# Patient Record
Sex: Male | Born: 1960 | Race: White | Hispanic: No | Marital: Married | Smoking: Never smoker
Health system: Southern US, Community
[De-identification: ages and names within clinical notes are randomized; demographics above are authoritative.]

## PROBLEM LIST (undated history)

## (undated) DIAGNOSIS — R0981 Nasal congestion: Secondary | ICD-10-CM

## (undated) DIAGNOSIS — Z8719 Personal history of other diseases of the digestive system: Secondary | ICD-10-CM

## (undated) DIAGNOSIS — Z800 Family history of malignant neoplasm of digestive organs: Secondary | ICD-10-CM

## (undated) DIAGNOSIS — Z8042 Family history of malignant neoplasm of prostate: Secondary | ICD-10-CM

## (undated) DIAGNOSIS — C61 Malignant neoplasm of prostate: Secondary | ICD-10-CM

## (undated) DIAGNOSIS — Z806 Family history of leukemia: Secondary | ICD-10-CM

## (undated) DIAGNOSIS — Z803 Family history of malignant neoplasm of breast: Secondary | ICD-10-CM

## (undated) DIAGNOSIS — J45909 Unspecified asthma, uncomplicated: Secondary | ICD-10-CM

## (undated) HISTORY — PX: HERNIA REPAIR: SHX51

## (undated) HISTORY — PX: NASAL SEPTUM SURGERY: SHX37

## (undated) HISTORY — DX: Family history of malignant neoplasm of breast: Z80.3

## (undated) HISTORY — DX: Nasal congestion: R09.81

## (undated) HISTORY — DX: Family history of malignant neoplasm of digestive organs: Z80.0

## (undated) HISTORY — DX: Family history of malignant neoplasm of prostate: Z80.42

## (undated) HISTORY — DX: Unspecified asthma, uncomplicated: J45.909

## (undated) HISTORY — DX: Family history of leukemia: Z80.6

---

## 1998-04-10 ENCOUNTER — Ambulatory Visit (HOSPITAL_COMMUNITY): Admission: RE | Admit: 1998-04-10 | Discharge: 1998-04-10 | Payer: Self-pay | Admitting: Otolaryngology

## 2010-05-04 ENCOUNTER — Encounter: Payer: Self-pay | Admitting: Orthopedic Surgery

## 2013-10-05 ENCOUNTER — Ambulatory Visit (HOSPITAL_BASED_OUTPATIENT_CLINIC_OR_DEPARTMENT_OTHER)
Admission: RE | Admit: 2013-10-05 | Discharge: 2013-10-05 | Disposition: A | Discharge status: Home / Self Care | Payer: 59 | Source: Ambulatory Visit | Attending: Pulmonary Disease | Admitting: Pulmonary Disease

## 2013-10-05 ENCOUNTER — Ambulatory Visit (INDEPENDENT_AMBULATORY_CARE_PROVIDER_SITE_OTHER): Payer: PRIVATE HEALTH INSURANCE | Admitting: Pulmonary Disease

## 2013-10-05 ENCOUNTER — Encounter: Payer: Self-pay | Admitting: Pulmonary Disease

## 2013-10-05 VITALS — BP 128/74 | HR 109 | Ht 71.0 in | Wt 224.0 lb

## 2013-10-05 DIAGNOSIS — R05 Cough: Secondary | ICD-10-CM

## 2013-10-05 DIAGNOSIS — R052 Subacute cough: Secondary | ICD-10-CM | POA: Insufficient documentation

## 2013-10-05 DIAGNOSIS — R059 Cough, unspecified: Secondary | ICD-10-CM

## 2013-10-05 MED ORDER — CHLORPHEN-PSEUDOEPH-METHSCOP 8-120-2.5 MG PO TB12: 1.0000 | ORAL_TABLET | Freq: Every day | ORAL | Status: DC

## 2013-10-05 MED ORDER — PSEUDOEPHEDRINE HCL ER 120 MG PO TB12: 120.0000 mg | ORAL_TABLET | Freq: Two times a day (BID) | ORAL | Status: DC

## 2013-10-05 MED ORDER — DEXTROMETHORPHAN POLISTIREX 30 MG/5ML PO LQCR: 5.0000 mL | Freq: Two times a day (BID) | ORAL | Status: DC

## 2013-10-05 NOTE — Assessment & Plan Note (Signed)
Your cough may be related to recent bronchitis or sinus drip Take DELSYM cough syrup 75ml twice daily -daytime Ok to take primatene at night Take chlortrimeton (or equivalent) 8 mg at bedtime x 4 weeks Take sudafed 120 mg XL once daily x 2 weeks Call if no better at 4 weeks CXR today

## 2013-10-05 NOTE — Progress Notes (Signed)
   Subjective:    Patient ID: Paul Potter, male    DOB: May 05, 1960, 53 y.o.   MRN: [id number]  HPI 53 year old never smoker presents for evaluation of subacute cough for 4 weeks. He reports that around [facility], he was in contact with a sick customer and then had URI symptoms requiring an urgent care visit. He was initially treated with Augmentin, prednisone and benzonatate without significant relief. Hydromet cough syrup made him itch .He then saw his PCP and received an albuterol MDI cause tremors and a Z-Pak. He saw Dr. Ernesto Rutherford and was told that this was not allergies.  He reports cough that is worse in the mornings, he reports a tickle in his throat, primatine at night gives him some relief. He denies heartburn, wheezing or sputum production. He is frustrated that her symptoms lasted so long. He had asthma as a child, never bothered him as an adult. He denies seasonal allergies.  Past Medical History  Diagnosis Date  . Asthma   . Sinus congestion     Past Surgical History  Procedure Laterality Date  . Nasal septum surgery      Allergies  Allergen Reactions  . Codeine     itch    History   Social History  . Marital Status: Married    Spouse Name: N/A    Number of Children: N/A  . Years of Education: N/A   Occupational History  . Not on file.   Social History Main Topics  . Smoking status: Never Smoker   . Smokeless tobacco: Never Used  . Alcohol Use: Yes     Comment: occasional glass of wine.  . Drug Use: No  . Sexual Activity: Not on file   Other Topics Concern  . Not on file   Social History Narrative  . No narrative on file    Family History  Problem Relation Age of Onset  . Asthma Son      Review of Systems  Constitutional: Negative for fever and unexpected weight change.  HENT: Negative for congestion, dental problem, ear pain, nosebleeds, postnasal drip, rhinorrhea, sinus pressure, sneezing, sore throat and trouble swallowing.   Eyes:  Negative for redness and itching.  Respiratory: Positive for cough, chest tightness and shortness of breath. Negative for wheezing.   Cardiovascular: Negative for palpitations and leg swelling.  Gastrointestinal: Negative for nausea and vomiting.  Genitourinary: Negative for dysuria.  Musculoskeletal: Negative for joint swelling.  Skin: Negative for rash.  Neurological: Negative for headaches.  Hematological: Does not bruise/bleed easily.  Psychiatric/Behavioral: Negative for dysphoric mood. The patient is not nervous/anxious.        Objective:   Physical Exam  Gen. Pleasant, obese, in no distress, normal affect ENT - no lesions, no post nasal drip, class 2-3 airway Neck: No JVD, no thyromegaly, no carotid bruits Lungs: no use of accessory muscles, no dullness to percussion, decreased without rales or rhonchi  Cardiovascular: Rhythm regular, heart sounds  normal, no murmurs or gallops, no peripheral edema Abdomen: soft and non-tender, no hepatosplenomegaly, BS normal. Musculoskeletal: No deformities, no cyanosis or clubbing Neuro:  alert, non focal, no tremors       Assessment & Plan:

## 2013-10-05 NOTE — Patient Instructions (Addendum)
Your cough may be related to recent bronchitis or sinus drip Take DELSYM cough syrup 17ml twice daily -daytime Ok to take primatene at night Take chlortrimeton (or equivalent) 8 mg at bedtime x 4 weeks Take sudafed 120 mg XL once daily x 2 weeks Call if no better at 4 weeks CXR today

## 2013-11-09 ENCOUNTER — Ambulatory Visit: Payer: PRIVATE HEALTH INSURANCE | Admitting: Pulmonary Disease

## 2013-12-06 ENCOUNTER — Encounter: Payer: PRIVATE HEALTH INSURANCE | Attending: Family Medicine | Admitting: Dietician

## 2013-12-06 ENCOUNTER — Encounter: Payer: Self-pay | Admitting: Dietician

## 2013-12-06 VITALS — Ht 69.0 in | Wt 217.3 lb

## 2013-12-06 DIAGNOSIS — Z713 Dietary counseling and surveillance: Secondary | ICD-10-CM | POA: Insufficient documentation

## 2013-12-06 DIAGNOSIS — E119 Type 2 diabetes mellitus without complications: Secondary | ICD-10-CM | POA: Diagnosis present

## 2013-12-06 NOTE — Patient Instructions (Signed)
Goals:  Follow Diabetes Meal Plan as instructed  Eat 3 meals and 2 snacks, every 3-5 hrs  Limit carbohydrate intake to 45-60 grams carbohydrate/meal  Limit carbohydrate intake to 15 grams carbohydrate/snack  Add lean protein foods to meals/snacks  Aim for 30 mins of physical activity daily  Drink mostly water

## 2013-12-06 NOTE — Progress Notes (Signed)
Appt start time: 0940 end time:  1040.  Assessment:  Patient was seen on  12/06/2013 for individual diabetes education. Paul Potter was just diagnosed with diabetes in July. Not currently testing or taking medication.  Has recently made some changes to his diet after being diagnosed. Drinking 1 diet coke, crystal light, and unsweet tea and has cut out bread, fried potatoes, and has switched whole wheat pasta. Eating mostly grilled meats instead of fried foods. Started exercising in the past few weeks. Was previously drinking regular 3-4 16 oz Cokes, was eating a lot of fast food, and was not exercising outside of yard work. Not skipping meals. Eats out about 14-15 meals per week. Has lost about 4 lbs since July and pants feel looser.   Works in [employer] and travels a lot for work every week. Needs help eating on the road. Lives with his wife and son and both do cooking and food shopping.   Patient Education Plan per assessed needs and concerns is to attend individual session for Diabetes Self Management Education.  Current HbA1c: 6.5% in July 2015  Preferred Learning Style:   No preference indicated   Learning Readiness:   Change in progress  MEDICATIONS: see list  DIETARY INTAKE:  Usual eating pattern includes 3 meals and 0-1 snacks per day.  Avoided foods include: raw tomatoes, asparagus, squash casserole, sushi, English peas  24-hr recall:  B ( 10:30-11:00 M): bacon and egg on the road Snk ( AM): none  L (3:00 PM): salad lunch on low calorie lunch menu at [facility] Tuesday Snk ( PM): low sugar yogurt every once in a while D ( PM):grilled chicken with salad  Snk ( PM): none Beverages: water with crystal light, 1 diet coked, unsweet tea, occasional wine or beer  Usual physical activity: 15-20 minutes of exercise bike 3-4 x week  Estimated energy needs: 2000 calories 225 g carbohydrates 150 g protein 56 g fat  Progress Towards Goal(s):  In progress.   Nutritional Diagnosis:  [place]-2.1  Inpaired nutrition utilization As related to glucose metabolism.  As evidenced by Hgb A1c of 6.5%.    Intervention:  Nutrition counseling provided.  Discussed diabetes disease process and treatment options.  Discussed physiology of diabetes and role of obesity on insulin resistance.  Encouraged moderate weight reduction to improve glucose levels.  Discussed role of medications and diet in glucose control  Provided education on macronutrients on glucose levels.  Provided education on carb counting, importance of regularly scheduled meals/snacks, and meal planning  Discussed effects of physical activity on glucose levels and long-term glucose control.  Recommended 150 minutes of physical activity/week.  Reviewed patient medications.  Discussed role of medication on blood glucose and possible side effects  Discussed blood glucose monitoring and interpretation.  Discussed recommended target ranges and individual ranges.    Described short-term complications: hyper- and hypo-glycemia.  Discussed causes,symptoms, and treatment options.  Discussed prevention, detection, and treatment of long-term complications.  Discussed the role of prolonged elevated glucose levels on body systems.  Discussed role of stress on blood glucose levels and discussed strategies to manage psychosocial issues.  Discussed recommendations for long-term diabetes self-care.  Established checklist for medical, dental, and emotional self-care.  Teaching Method Utilized:  Visual Auditory Hands on  Handouts given during visit include:  Living Well With Diabetes  Blood Glucose Monitoring  Yellow Card  15 g CHO Snacks  Barriers to learning/adherence to lifestyle change: none  Diabetes self-care support plan:   [facility] support group  Wife and son  Demonstrated degree of understanding via:  Teach Back   Monitoring/Evaluation:  Dietary intake, exercise, and body weight prn.

## 2014-04-13 HISTORY — PX: COLONOSCOPY: SHX174

## 2016-03-16 HISTORY — PX: PROSTATE BIOPSY: SHX241

## 2016-03-30 ENCOUNTER — Other Ambulatory Visit: Payer: Self-pay | Admitting: Urology

## 2016-03-30 DIAGNOSIS — C61 Malignant neoplasm of prostate: Secondary | ICD-10-CM

## 2016-04-09 ENCOUNTER — Encounter (HOSPITAL_COMMUNITY)
Admission: RE | Admit: 2016-04-09 | Discharge: 2016-04-09 | Disposition: A | Discharge status: Home / Self Care | Payer: Commercial Managed Care - PPO | Source: Ambulatory Visit | Attending: Urology | Admitting: Urology

## 2016-04-09 DIAGNOSIS — C61 Malignant neoplasm of prostate: Secondary | ICD-10-CM | POA: Diagnosis not present

## 2016-04-09 MED ORDER — TECHNETIUM TC 99M MEDRONATE IV KIT
22.0000 | PACK | Freq: Once | INTRAVENOUS | Status: AC | PRN
  Administered 2016-04-09: 22 via INTRAVENOUS

## 2016-04-21 ENCOUNTER — Encounter: Payer: Self-pay | Admitting: Radiation Oncology

## 2016-05-05 ENCOUNTER — Encounter: Payer: Self-pay | Admitting: Radiation Oncology

## 2016-05-05 NOTE — Progress Notes (Addendum)
GU Location of Tumor / Histology: prostatic adenocarcinoma  If Prostate Cancer, Gleason Score is (4 + 5) and PSA is (4.03) Sept 2017 and while on finasteride  2015 PSA 2.89 while on Finasteride. Reports he had been taking Finasteride for 4-5 years. Reports he stopped finasteride a few weeks ago.  Biopsies of prostate (if applicable) revealed:    Past/Anticipated interventions by urology, if any: prescribed finasteride, prostate biopsy, tentatively scheduled prostatectomy, referral to radiation oncology  Past/Anticipated interventions by medical oncology, if any: no  Weight changes, if any: no  Bowel/Bladder complaints, if any: IPSS:9. Denies dysuria, hematuria, or leakage   Nausea/Vomiting, if any: no  Pain issues, if any:  no  SAFETY ISSUES:  Prior radiation? no  Pacemaker/ICD? no  Possible current pregnancy? no  Is the patient on methotrexate? no  Current Complaints / other details:  56 year old male. Ax. Codeine. Married. Consults with Alinda Money 05/12/16 then, Dr. Tresa Endo (of [facility])

## 2016-05-06 ENCOUNTER — Encounter: Payer: Self-pay | Admitting: Radiation Oncology

## 2016-05-06 ENCOUNTER — Ambulatory Visit
Admission: RE | Admit: 2016-05-06 | Discharge: 2016-05-06 | Disposition: A | Discharge status: Home / Self Care | Payer: Commercial Managed Care - PPO | Source: Ambulatory Visit | Attending: Radiation Oncology | Admitting: Radiation Oncology

## 2016-05-06 VITALS — BP 146/90 | HR 83 | Resp 18 | Ht 67.0 in | Wt 222.8 lb

## 2016-05-06 DIAGNOSIS — C61 Malignant neoplasm of prostate: Secondary | ICD-10-CM | POA: Insufficient documentation

## 2016-05-06 DIAGNOSIS — Z79899 Other long term (current) drug therapy: Secondary | ICD-10-CM | POA: Insufficient documentation

## 2016-05-06 HISTORY — DX: Malignant neoplasm of prostate: C61

## 2016-05-06 NOTE — Progress Notes (Signed)
See progress note under physician encounter. 

## 2016-05-06 NOTE — Progress Notes (Signed)
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    ICD-9-CM ICD-10-CM   1. Malignant neoplasm of prostate ([place]) 185 C61     HISTORY OF PRESENT ILLNESS: Paul Potter is a 56 y.o. male seen at the request of Dr. Pilar Jarvis.  The patient is established with Dr. Pilar Jarvis and undergoing routine PSA screenings. His last PSA was performed 12/13/15 and found to be a 4.03 and while of Finasteride. His previous PSA was 2015 and found to be 2.89 while on Finasteride. His digital rectal examination was abnormal, finding bilateral firmness with nodule at right apex. The patient proceeded to transrectal ultrasound with 12 biopsies of the prostate on 03/16/16 . The prostate volume measured 29.98 cc. Out of 12 core biopsies, 7 were positive. The maximum Gleason score was 4+5, and this was seen in left base lateral and left mid.     The patient reviewed the biopsy results with his urologist and he has kindly been referred today for discussion of potential radiation treatment options.  CT pelvis and bone scan were negative for metastatic disease.  On review of systems, the patient denies any weight changes. He denies pain at this time. He reports nocturia x 3. The patient denies nausea or vomiting. The patient reports he stopped taking Finasteride approximately 2 weeks ago. He reports he has an appointment with Dr. Alinda Money on 05/12/16 for a second opinion.  PREVIOUS RADIATION THERAPY: No  PAST MEDICAL HISTORY:  Past Medical History:  Diagnosis Date  . Asthma   . Prostate cancer ([place])   . Sinus congestion       PAST SURGICAL  HISTORY: Past Surgical History:  Procedure Laterality Date  . HERNIA REPAIR    . NASAL SEPTUM SURGERY    . PROSTATE BIOPSY      FAMILY HISTORY:  Family History  Problem Relation Age of Onset  . Asthma Son   . Cancer Father     hairy cell leukemia  . Cancer Sister     breast    SOCIAL HISTORY:  Social History   Social History  . Marital status: Married    Spouse name: N/A  . Number of children: N/A  . Years of education: N/A   Occupational History  . Not on file.   Social History Main Topics  . Smoking status: Never Smoker  . Smokeless tobacco: Never Used  . Alcohol use Yes     Comment: occasional glass of wine.  . Drug use: No  . Sexual activity: Yes   Other Topics Concern  . Not on file   Social History Narrative  . No narrative on file    ALLERGIES: Hydrocodone and Codeine  MEDICATIONS:  Current Outpatient Prescriptions  Medication Sig Dispense Refill  . Multiple Vitamins-Minerals (MULTIVITAMIN & MINERAL PO) Take by mouth.    Marland Kitchen OVER THE COUNTER MEDICATION Primitine (OTC) 2 tablets qhs.    . chlorpheniramine-pseudoephedrine-methscopolamine (MESCOLOR) 8-120-2.5 MG per 12 hr tablet Take 1 tablet by mouth at bedtime. (Patient not taking: Reported on 05/06/2016) 28 tablet 0  . dextromethorphan (DELSYM) 30 MG/5ML liquid Take 5 mLs (30 mg total) by mouth  2 (two) times daily. (Patient not taking: Reported on 05/06/2016) 148 mL 0  . finasteride (PROSCAR) 5 MG tablet Take 5 mg by mouth daily.    . pseudoephedrine (SUDAFED 12 HOUR) 120 MG 12 hr tablet Take 1 tablet (120 mg total) by mouth 2 (two) times daily. (Patient not taking: Reported on 05/06/2016) 14 tablet 0   No current facility-administered medications for this encounter.     REVIEW OF SYSTEMS:  On review of systems, the patient reports that he is doing well overall. He denies any chest pain, shortness of breath, cough, fevers, chills, night sweats, unintended weight changes. He denies any bowel disturbances,  and denies abdominal pain, nausea or vomiting. He denies any new musculoskeletal or joint aches or pains. A complete review of systems is obtained and is otherwise negative. The patient completed an IPSS and IIEF questionnaire.  His IPSS score was 9 indicating moderate urinary outflow obstructive symptoms.  He indicated that his erectile function is sometimes able to complete sexual activity.    PHYSICAL EXAM:  Wt Readings from Last 3 Encounters:  05/06/16 222 lb 12.8 oz (101.1 kg)  12/06/13 217 lb 4.8 oz (98.6 kg)  10/05/13 224 lb (101.6 kg)   Temp Readings from Last 3 Encounters:  No data found for Temp   BP Readings from Last 3 Encounters:  05/06/16 (!) 146/90  10/05/13 128/74   Pulse Readings from Last 3 Encounters:  05/06/16 83  10/05/13 (!) 109   Pain Scale 0/10 In general this is a well appearing Caucasian gnetleman in no acute distress. He is alert and oriented x4 and appropriate throughout the examination.   KPS = 100  100 - Normal; no complaints; no evidence of disease. 90   - Able to carry on normal activity; minor signs or symptoms of disease. 80   - Normal activity with effort; some signs or symptoms of disease. 20   - Cares for self; unable to carry on normal activity or to do active work. 60   - Requires occasional assistance, but is able to care for most of his personal needs. 50   - Requires considerable assistance and frequent medical care. 70   - Disabled; requires special care and assistance. 24   - Severely disabled; hospital admission is indicated although death not imminent. 33   - Very sick; hospital admission necessary; active supportive treatment necessary. 10   - Moribund; fatal processes progressing rapidly. 0     - Dead  Karnofsky DA, Abelmann Hobson, Craver LS and Burchenal JH [phone]) The use of the nitrogen mustards in the palliative treatment of carcinoma: with particular reference to bronchogenic carcinoma Cancer 1 634-56  LABORATORY DATA:  No  results found for: WBC, HGB, HCT, MCV, PLT No results found for: NA, K, CL, CO2 No results found for: ALT, AST, GGT, ALKPHOS, BILITOT   RADIOGRAPHY: Nm Bone Scan Whole Body  Result Date: 04/09/2016 CLINICAL DATA:  Prostate cancer. EXAM: NUCLEAR MEDICINE WHOLE BODY BONE SCAN TECHNIQUE: Whole body anterior and posterior images were obtained approximately 3 hours after intravenous injection of radiopharmaceutical. RADIOPHARMACEUTICALS:  22 mCi Technetium-76m MDP IV COMPARISON:  None. FINDINGS: Slight increased activity at the left sternoclavicular joint at the left sternomanubrial joint. These are probably arthritic in origin. Focal increased activity at the right first metatarsal phalangeal joint. No other areas of abnormally increased or decreased activity IMPRESSION: No evidence suggestive of metastatic disease. 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  Electronically Signed   By: Lorriane Shire M.D.   On: 04/09/2016 14:41      IMPRESSION/PLAN: 1. 56 y.o. 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   We talked about surgical outcomes.  We reviewed the the implications of positive margins, extracapsular extension, and seminal vesicle involvement on the risk of prostate cancer recurrence. We reviewed some of the evidence suggesting an advantage for patients who undergo adjuvant radiotherapy in the setting in terms of disease control and overall survival. We also discussed some of the dilemmas related to the available evidence.  We discussed the SWOG trial which did show an improvement in disease-free survival as well as overall survival using adjuvant radiotherapy. However, we discussed the fact that the study did not carefully control the usage of adjuvant radiotherapy in the observation arm. There is increasing evidence that careful surveillance with ultrasensitive PSA may  provide an opportunity for early salvage in patients who undergo observation, which can lead to excellent results in terms of disease control and survival. We discussed radiation treatment directed to the prostatic fossa with regard to the logistics and delivery of external beam radiation treatment.  I'll look forward to following up with the patient post-op if clinicaly indicated.   Freeman Caldron PAC  And    Tyler Pita, MD [place] Director and Director of Stereotactic Radiosurgery Direct Dial: [phone]  Fax: [phone] [email or web address]  Skype  [email or web address]  This document serves as a record of services personally performed by Tyler Pita, MD and Freeman Caldron, PA-C. It was created on their behalf by Maryla Morrow, a trained medical scribe. The creation of this record is based on the scribe's personal observations and the provider's statements to them. This document has been checked and approved by the attending provider.

## 2016-05-14 ENCOUNTER — Other Ambulatory Visit: Payer: Self-pay | Admitting: Urology

## 2016-05-26 ENCOUNTER — Telehealth: Payer: Self-pay | Admitting: Medical Oncology

## 2016-05-26 NOTE — Progress Notes (Signed)
I called Paul Potter to introduce myself as the prostate navigator and my role. I was not able to meet him the day he consulted with Dr. Tammi Klippel. He is scheduled for robotic prostatectomy 3/12 with Dr. Alinda Money.I He attended his first pelvic PT classes yesterday. We discussed how important the exercises will be to regain his continence. He states that there is a 40% chance he may need radiation post-op. I asked him to call me with any questions or concerns. He voiced understanding.

## 2016-06-10 ENCOUNTER — Encounter (HOSPITAL_COMMUNITY): Admission: RE | Payer: Self-pay | Source: Ambulatory Visit

## 2016-06-10 ENCOUNTER — Inpatient Hospital Stay (HOSPITAL_COMMUNITY): Admission: RE | Admit: 2016-06-10 | Payer: PRIVATE HEALTH INSURANCE | Source: Ambulatory Visit | Admitting: Urology

## 2016-06-10 SURGERY — PROSTATECTOMY, RADICAL, ROBOT-ASSISTED, LAPAROSCOPIC
Anesthesia: General

## 2016-06-12 ENCOUNTER — Encounter (HOSPITAL_COMMUNITY): Payer: Self-pay

## 2016-06-12 ENCOUNTER — Encounter (HOSPITAL_COMMUNITY)
Admission: RE | Admit: 2016-06-12 | Discharge: 2016-06-12 | Disposition: A | Discharge status: Home / Self Care | Payer: Commercial Managed Care - PPO | Source: Ambulatory Visit | Attending: Urology | Admitting: Urology

## 2016-06-12 DIAGNOSIS — C61 Malignant neoplasm of prostate: Secondary | ICD-10-CM | POA: Insufficient documentation

## 2016-06-12 DIAGNOSIS — Z01818 Encounter for other preprocedural examination: Secondary | ICD-10-CM | POA: Diagnosis not present

## 2016-06-12 HISTORY — DX: Personal history of other diseases of the digestive system: Z87.19

## 2016-06-12 LAB — BASIC METABOLIC PANEL
Anion gap: 9 (ref 5–15)
BUN: 16 mg/dL (ref 6–20)
CHLORIDE: 103 mmol/L (ref 101–111)
CO2: 26 mmol/L (ref 22–32)
CREATININE: 0.96 mg/dL (ref 0.61–1.24)
Calcium: 9.2 mg/dL (ref 8.9–10.3)
GFR calc Af Amer: >60 mL/min (ref >60)
GFR calc non Af Amer: >60 mL/min (ref >60)
GLUCOSE: 117 mg/dL — AB (ref 65–99)
POTASSIUM: 3.8 mmol/L (ref 3.5–5.1)
Sodium: 138 mmol/L (ref 135–145)

## 2016-06-12 LAB — CBC
HCT: 43.8 % (ref 39.0–52.0)
Hemoglobin: 15.2 g/dL (ref 13.0–17.0)
MCH: 30.2 pg (ref 26.0–34.0)
MCHC: 34.7 g/dL (ref 30.0–36.0)
MCV: 86.9 fL (ref 78.0–100.0)
Platelets: 248 10*3/uL (ref 150–400)
RBC: 5.04 MIL/uL (ref 4.22–5.81)
RDW: 12.6 % (ref 11.5–15.5)
WBC: 9.5 10*3/uL (ref 4.0–10.5)

## 2016-06-12 LAB — ABO/RH: ABO/RH(D): O NEG

## 2016-06-12 NOTE — Patient Instructions (Addendum)
Paul Potter  06/12/2016   Your procedure is scheduled on: 06-22-16  Report to [facility] Main  Entrance take [facility]  elevators to 3rd floor to  Paul Potter at [phone].  Call this number if you have problems the morning of surgery [phone]   Remember: ONLY 1 PERSON MAY GO WITH YOU TO SHORT STAY TO GET  READY MORNING OF [place].  Do not eat food :After Midnight Saturday 06-20-16. Clear liquids all day Sunday 06-21-16 as per Dr Lynne Logan orders for bowel prep. Nothing by mouth after midnight Sunday night.      Take these medicines the morning of surgery with A SIP OF WATER: none                                You may not have any metal on your body including hair pins and              piercings  Do not wear jewelry, make-up, lotions, powders or perfumes, deodorant             Do not wear nail polish.  Do not shave  48 hours prior to surgery.              Men may shave face and neck.   Do not bring valuables to the hospital. [place].  Contacts, dentures or bridgework may not be worn into surgery.  Leave suitcase in the car. After surgery it may be brought to your room.                Please read over the following fact sheets you were given: _____________________________________________________________________     CLEAR LIQUID DIET   Foods Allowed                                                                     Foods Excluded  Coffee and tea, regular and decaf                             liquids that you cannot  Plain Jell-O in any flavor                                             see through such as: Fruit ices (not with fruit pulp)                                     milk, soups, orange juice  Iced Popsicles                                    All solid food Carbonated beverages, regular and diet  Cranberry, grape and apple juices Sports drinks like  Gatorade Lightly seasoned clear broth or consume(fat free) Sugar, honey syrup  Sample Menu Breakfast                                Lunch                                     Supper Cranberry juice                    Beef broth                            Chicken broth Jell-O                                     Grape juice                           Apple juice Coffee or tea                        Jell-O                                      Popsicle                                                Coffee or tea                        Coffee or tea  _____________________________________________________________________             [facility] - Preparing for Surgery Before surgery, you can play an important role.  Because skin is not sterile, your skin needs to be as free of germs as possible.  You can reduce the number of germs on your skin by washing with CHG (chlorahexidine gluconate) soap before surgery.  CHG is an antiseptic cleaner which kills germs and bonds with the skin to continue killing germs even after washing. Please DO NOT use if you have an allergy to CHG or antibacterial soaps.  If your skin becomes reddened/irritated stop using the CHG and inform your nurse when you arrive at Short Stay. Do not shave (including legs and underarms) for at least 48 hours prior to the first CHG shower.  You may shave your face/neck. Please follow these instructions carefully:  1.  Shower with CHG Soap the night before surgery and the  morning of Surgery.  2.  If you choose to wash your hair, wash your hair first as usual with your  normal  shampoo.  3.  After you shampoo, rinse your hair and body thoroughly to remove the  shampoo.                           4.  Use CHG as you would any other liquid soap.  You can apply chg  directly  to the skin and wash                       Gently with a scrungie or clean washcloth.  5.  Apply the CHG Soap to your body ONLY FROM THE NECK DOWN.   Do not use on face/  open                           Wound or open sores. Avoid contact with eyes, ears mouth and genitals (private parts).                       Wash face,  Genitals (private parts) with your normal soap.             6.  Wash thoroughly, paying special attention to the area where your surgery  will be performed.  7.  Thoroughly rinse your body with warm water from the neck down.  8.  DO NOT shower/wash with your normal soap after using and rinsing off  the CHG Soap.                9.  Pat yourself dry with a clean towel.            10.  Wear clean pajamas.            11.  Place clean sheets on your bed the night of your first shower and do not  sleep with pets. Day of Surgery : Do not apply any lotions/deodorants the morning of surgery.  Please wear clean clothes to the hospital/surgery center.  FAILURE TO FOLLOW THESE INSTRUCTIONS MAY RESULT IN THE CANCELLATION OF YOUR SURGERY PATIENT SIGNATURE_________________________________  NURSE SIGNATURE__________________________________  ________________________________________________________________________   Paul Potter  An incentive spirometer is a tool that can help keep your lungs clear and active. This tool measures how well you are filling your lungs with each breath. Taking long deep breaths may help reverse or decrease the chance of developing breathing (pulmonary) problems (especially infection) following:  A long period of time when you are unable to move or be active. BEFORE THE PROCEDURE   If the spirometer includes an indicator to show your best effort, your nurse or respiratory therapist will set it to a desired goal.  If possible, sit up straight or lean slightly forward. Try not to slouch.  Hold the incentive spirometer in an upright position. INSTRUCTIONS FOR USE  1. Sit on the edge of your bed if possible, or sit up as far as you can in bed or on a chair. 2. Hold the incentive spirometer in an upright  position. 3. Breathe out normally. 4. Place the mouthpiece in your mouth and seal your lips tightly around it. 5. Breathe in slowly and as deeply as possible, raising the piston or the ball toward the top of the column. 6. Hold your breath for 3-5 seconds or for as long as possible. Allow the piston or ball to fall to the bottom of the column. 7. Remove the mouthpiece from your mouth and breathe out normally. 8. Rest for a few seconds and repeat Steps 1 through 7 at least 10 times every 1-2 hours when you are awake. Take your time and take a few normal breaths between deep breaths. 9. The spirometer may include an indicator to show your best effort. Use the indicator as a goal to work toward during each repetition.  10. After each set of 10 deep breaths, practice coughing to be sure your lungs are clear. If you have an incision (the cut made at the time of surgery), support your incision when coughing by placing a pillow or rolled up towels firmly against it. Once you are able to get out of bed, walk around indoors and cough well. You may stop using the incentive spirometer when instructed by your caregiver.  RISKS AND COMPLICATIONS  Take your time so you do not get dizzy or light-headed.  If you are in pain, you may need to take or ask for pain medication before doing incentive spirometry. It is harder to take a deep breath if you are having pain. AFTER USE  Rest and breathe slowly and easily.  It can be helpful to keep track of a log of your progress. Your caregiver can provide you with a simple table to help with this. If you are using the spirometer at home, follow these instructions: [place] IF:   You are having difficultly using the spirometer.  You have trouble using the spirometer as often as instructed.  Your pain medication is not giving enough relief while using the spirometer.  You develop fever of 100.5 F (38.1 C) or higher. SEEK IMMEDIATE MEDICAL CARE IF:    You cough up bloody sputum that had not been present before.  You develop fever of 102 F (38.9 C) or greater.  You develop worsening pain at or near the incision site. MAKE SURE YOU:   Understand these instructions.  Will watch your condition.  Will get help right away if you are not doing well or get worse. Document Released: 08/10/2006 Document Revised: 06/22/2011 Document Reviewed: 10/11/2006 ExitCare Patient Information 2014 ExitCare, [place].   ________________________________________________________________________  WHAT IS A BLOOD TRANSFUSION? Blood Transfusion Information  A transfusion is the replacement of blood or some of its parts. Blood is made up of multiple cells which provide different functions.  Red blood cells carry oxygen and are used for blood loss replacement.  White blood cells fight against infection.  Platelets control bleeding.  Plasma helps clot blood.  Other blood products are available for specialized needs, such as hemophilia or other clotting disorders. BEFORE THE TRANSFUSION  Who gives blood for transfusions?   Healthy volunteers who are fully evaluated to make sure their blood is safe. This is blood bank blood. Transfusion therapy is the safest it has ever been in the practice of medicine. Before blood is taken from a donor, a complete history is taken to make sure that person has no history of diseases nor engages in risky social behavior (examples are intravenous drug use or sexual activity with multiple partners). The donor's travel history is screened to minimize risk of transmitting infections, such as malaria. The donated blood is tested for signs of infectious diseases, such as HIV and hepatitis. The blood is then tested to be sure it is compatible with you in order to minimize the chance of a transfusion reaction. If you or a relative donates blood, this is often done in anticipation of surgery and is not appropriate for emergency  situations. It takes many days to process the donated blood. RISKS AND COMPLICATIONS Although transfusion therapy is very safe and saves many lives, the main dangers of transfusion include:   Getting an infectious disease.  Developing a transfusion reaction. This is an allergic reaction to something in the blood you were given. Every precaution is taken to prevent this. The  decision to have a blood transfusion has been considered carefully by your caregiver before blood is given. Blood is not given unless the benefits outweigh the risks. AFTER THE TRANSFUSION  Right after receiving a blood transfusion, you will usually feel much better and more energetic. This is especially true if your red blood cells have gotten low (anemic). The transfusion raises the level of the red blood cells which carry oxygen, and this usually causes an energy increase.  The nurse administering the transfusion will monitor you carefully for complications. HOME CARE INSTRUCTIONS  No special instructions are needed after a transfusion. You may find your energy is better. Speak with your caregiver about any limitations on activity for underlying diseases you may have. SEEK MEDICAL CARE IF:   Your condition is not improving after your transfusion.  You develop redness or irritation at the intravenous (IV) site. SEEK IMMEDIATE MEDICAL CARE IF:  Any of the following symptoms occur over the next 12 hours:  Shaking chills.  You have a temperature by mouth above 102 F (38.9 C), not controlled by medicine.  Chest, back, or muscle pain.  People around you feel you are not acting correctly or are confused.  Shortness of breath or difficulty breathing.  Dizziness and fainting.  You get a rash or develop hives.  You have a decrease in urine output.  Your urine turns a dark color or changes to pink, red, or brown. Any of the following symptoms occur over the next 10 days:  You have a temperature by mouth above  102 F (38.9 C), not controlled by medicine.  Shortness of breath.  Weakness after normal activity.  The white part of the eye turns yellow (jaundice).  You have a decrease in the amount of urine or are urinating less often.  Your urine turns a dark color or changes to pink, red, or brown. Document Released: 03/27/2000 Document Revised: 06/22/2011 Document Reviewed: 11/14/2007 [facility] Patient Information 2014 [place], [place].  _______________________________________________________________________

## 2016-06-19 NOTE — H&P (Signed)
CC/HPI: CC: Prostate Cancer    Paul Potter is a 56 year old gentleman who was noted to have an elevated PSA of 4.03 while taking 5 ARI medication for BPH. He underwent a TRUS biopsy of the prostate by Dr. Pilar Jarvis on 03/16/16 confirming Gleason 4+5=9 adenocarcinoma of the prostate with 7 out of 12 biopsy cores positive for malignancy.   Family history: None.   Imaging studies:  CT (04/09/16) - negative for metastatic disease  Bone scan (04/09/16) - negative for metastatic disease   PMH: He has a history of asthma and gout.  PSH: Right inguinal hernia repair.   TNM stage: cT2c (bilateral firmness)  PSA: 4.03 (on 5 ARI)  Gleason score: 4+5=9  Biopsy (03/16/16): 7/12 cores positive  Left: L lateral apex (60%, 4+4=8), L apex (70%, 4+4=8), L lateral mid (90%, 4+4=8, PNI), L mid (60%, 4+5=9), L lateral base (90%, 4+5=9), L base (60%, 4+4=8, PNI)  Right: R apex (5%, 3+3=6)  Prostate volume: 30.0 cc   Nomogram  OC disease: 16%  EPE: 80%  SVI: 35%  LNI: 34%  PFS (5 year, 10 year): 42%,28%   Urinary function: IPSS is 10.  Erectile function: SHIM score is 21.     ALLERGIES: Codeine No Allergies    MEDICATIONS: Finasteride 5 MG Oral Tablet Oral  Multi-Day TABS Oral  Vitamin D     GU PSH: Locm 300-399Mg /Ml Iodine,1Ml - 04/09/2016 Prostate Needle Biopsy - 03/16/2016    NON-GU PSH: Inguinal Hernia Repair > 5 yrs, Right, inguinal Surgical Pathology, Gross And Microscopic Examination For Prostate Needle - 03/16/2016    GU PMH: Prostate Cancer - 03/30/2016 BPH w/o LUTS - 02/11/2016 Nodular prostate w/o LUTS, Nodular prostate without lower urinary tract symptoms - 2014 Urinary Frequency, Increased urinary frequency - 2014      PMH Notes:    NON-GU PMH: Asthma, Asthma - 2014 Glycosuria, Glycosuria - 2014 Gout    FAMILY HISTORY: 1 son - Son Family Health Status - Mother's Age - Runs In Family Father Deceased At [employer] ___ - Runs In Family father still living 22 -  Father mother deceased at age 72 - Mother   SOCIAL HISTORY: Marital Status: Married Current Smoking Status: Patient has never smoked.  Drinks 1 drink per week.  Drinks 2 caffeinated drinks per day. Patient's occupation Brewing technologist.     Notes: Marital History - Currently Married, Occupation:, Caffeine Use, Never A Smoker, Alcohol Use   REVIEW OF SYSTEMS:    GU Review Male:   Patient denies frequent urination, hard to postpone urination, burning/ pain with urination, get up at night to urinate, leakage of urine, stream starts and stops, trouble starting your streams, and have to strain to urinate .  Gastrointestinal (Upper):   Patient denies nausea and vomiting.  Gastrointestinal (Lower):   Patient denies diarrhea and constipation.  Constitutional:   Patient denies fever, night sweats, weight loss, and fatigue.  Skin:   Patient denies skin rash/ lesion and itching.  Eyes:   Patient denies blurred vision and double vision.  Ears/ Nose/ Throat:   Patient denies sore throat and sinus problems.  Hematologic/Lymphatic:   Patient denies swollen glands and easy bruising.  Cardiovascular:   Patient denies leg swelling and chest pains.  Respiratory:   Patient denies cough and shortness of breath.  Endocrine:   Patient denies excessive thirst.  Musculoskeletal:   Patient denies joint pain and back pain.  Neurological:   Patient denies headaches and dizziness.  Psychologic:  Patient denies depression and anxiety.   VITAL SIGNS:   Weight 221 lb / 100.24 kg  Height 67 in / 170.18 cm  BMI 34.6 kg/m     MULTI-SYSTEM PHYSICAL EXAMINATION:    Constitutional: Well-nourished. No physical deformities. Normally developed. Good grooming.  Neck: Neck symmetrical, not swollen. Normal tracheal position.  Respiratory: No labored breathing, no use of accessory muscles. Clear bilaterally.  Cardiovascular: Normal temperature, normal extremity pulses, no swelling, no varicosities. RRR.  Lymphatic: No  enlargement of neck, axillae, groin.  Skin: No paleness, no jaundice, no cyanosis. No lesion, no ulcer, no rash.  Neurologic / Psychiatric: Oriented to time, oriented to place, oriented to person. No depression, no anxiety, no agitation.  Gastrointestinal: Mild obesity. Right inguinal scar.  Eyes: Normal conjunctivae. Normal eyelids.  Ears, Nose, Mouth, and Throat: Left ear no scars, no lesions, no masses. Right ear no scars, no lesions, no masses. Nose no scars, no lesions, no masses. Normal hearing. Normal lips.  Musculoskeletal: Normal gait and station of head and neck.       ASSESSMENT:      ICD-10 Details  1 GU:   Prostate Cancer - C61    PLAN:      1. Prostate cancer: He has elected surgical therapy and will undergo a right nerve sparing robot-assisted laparoscopic radical prostatectomy and bilateral pelvic lymphadenectomy. I discussed the potential benefits and risks of the procedure, side effects of the proposed treatment, the likelihood of the patient achieving the goals of the procedure, and any potential problems that might occur during the procedure or recuperation.

## 2016-06-22 ENCOUNTER — Ambulatory Visit (HOSPITAL_COMMUNITY): Payer: Commercial Managed Care - PPO | Admitting: Anesthesiology

## 2016-06-22 ENCOUNTER — Encounter (HOSPITAL_COMMUNITY): Payer: Self-pay

## 2016-06-22 ENCOUNTER — Encounter (HOSPITAL_COMMUNITY)
Admission: RE | Disposition: A | Discharge status: Home / Self Care | Payer: Self-pay | Source: Ambulatory Visit | Attending: Urology

## 2016-06-22 ENCOUNTER — Ambulatory Visit (HOSPITAL_COMMUNITY)
Admission: RE | Admit: 2016-06-22 | Discharge: 2016-06-23 | Disposition: A | Discharge status: Home / Self Care | Payer: Commercial Managed Care - PPO | Source: Ambulatory Visit | Attending: Urology | Admitting: Urology

## 2016-06-22 DIAGNOSIS — J45909 Unspecified asthma, uncomplicated: Secondary | ICD-10-CM | POA: Diagnosis not present

## 2016-06-22 DIAGNOSIS — C61 Malignant neoplasm of prostate: Secondary | ICD-10-CM | POA: Diagnosis not present

## 2016-06-22 DIAGNOSIS — M109 Gout, unspecified: Secondary | ICD-10-CM | POA: Insufficient documentation

## 2016-06-22 DIAGNOSIS — Z6834 Body mass index (BMI) 34.0-34.9, adult: Secondary | ICD-10-CM | POA: Insufficient documentation

## 2016-06-22 HISTORY — PX: LAPAROSCOPY W/ LYMPHADENECTOMY / SAMPLING / BIOPSY LYMPH NODE: SUR806

## 2016-06-22 HISTORY — PX: ROBOT ASSISTED LAPAROSCOPIC RADICAL PROSTATECTOMY: SHX5141

## 2016-06-22 LAB — HEMOGLOBIN AND HEMATOCRIT, BLOOD
HCT: 44.2 % (ref 39.0–52.0)
HEMOGLOBIN: 14.8 g/dL (ref 13.0–17.0)

## 2016-06-22 LAB — TYPE AND SCREEN
ABO/RH(D): O NEG
Antibody Screen: NEGATIVE

## 2016-06-22 SURGERY — ROBOTIC ASSISTED LAPAROSCOPIC RADICAL PROSTATECTOMY LEVEL 1
Anesthesia: General

## 2016-06-22 MED ORDER — ROCURONIUM BROMIDE 50 MG/5ML IV SOSY
PREFILLED_SYRINGE | INTRAVENOUS | Status: AC
  Filled 2016-06-22: qty 5

## 2016-06-22 MED ORDER — BUPIVACAINE-EPINEPHRINE 0.25% -1:200000 IJ SOLN
INTRAMUSCULAR | Status: AC
Start: 2016-06-22 — End: 2016-06-22
  Filled 2016-06-22: qty 1

## 2016-06-22 MED ORDER — PROMETHAZINE HCL 25 MG/ML IJ SOLN: 6.2500 mg | INTRAMUSCULAR | Status: DC | PRN

## 2016-06-22 MED ORDER — ROCURONIUM BROMIDE 100 MG/10ML IV SOLN
INTRAVENOUS | Status: DC | PRN
  Administered 2016-06-22 (×2): 10 mg via INTRAVENOUS
  Administered 2016-06-22: 50 mg via INTRAVENOUS
  Administered 2016-06-22: 10 mg via INTRAVENOUS
  Administered 2016-06-22: 20 mg via INTRAVENOUS

## 2016-06-22 MED ORDER — MIDAZOLAM HCL 2 MG/2ML IJ SOLN
INTRAMUSCULAR | Status: AC
  Filled 2016-06-22: qty 2

## 2016-06-22 MED ORDER — SODIUM CHLORIDE 0.9 % IR SOLN
Status: DC | PRN
  Administered 2016-06-22: 1000 mL via INTRAVESICAL

## 2016-06-22 MED ORDER — LACTATED RINGERS IV SOLN
INTRAVENOUS | Status: DC | PRN
  Administered 2016-06-22: 1000 mL

## 2016-06-22 MED ORDER — HYDROMORPHONE HCL 1 MG/ML IJ SOLN
0.2500 mg | INTRAMUSCULAR | Status: DC | PRN
  Administered 2016-06-22: 0.25 mg via INTRAVENOUS
  Administered 2016-06-22 (×2): 0.5 mg via INTRAVENOUS
  Administered 2016-06-22: 0.25 mg via INTRAVENOUS
  Administered 2016-06-22: 0.5 mg via INTRAVENOUS

## 2016-06-22 MED ORDER — DIPHENHYDRAMINE HCL 50 MG/ML IJ SOLN
12.5000 mg | Freq: Four times a day (QID) | INTRAMUSCULAR | Status: DC | PRN
Start: 2016-06-22 — End: 2016-06-23

## 2016-06-22 MED ORDER — ACETAMINOPHEN 325 MG PO TABS
650.0000 mg | ORAL_TABLET | ORAL | Status: DC | PRN
  Administered 2016-06-22: 650 mg via ORAL
  Filled 2016-06-22: qty 2

## 2016-06-22 MED ORDER — ROCURONIUM BROMIDE 50 MG/5ML IV SOSY
PREFILLED_SYRINGE | INTRAVENOUS | Status: AC
Start: 2016-06-22 — End: 2016-06-22
  Filled 2016-06-22: qty 5

## 2016-06-22 MED ORDER — SULFAMETHOXAZOLE-TRIMETHOPRIM 800-160 MG PO TABS: 1.0000 | ORAL_TABLET | Freq: Two times a day (BID) | ORAL | 0 refills | Status: DC

## 2016-06-22 MED ORDER — MEPERIDINE HCL 50 MG/ML IJ SOLN: 6.2500 mg | INTRAMUSCULAR | Status: DC | PRN

## 2016-06-22 MED ORDER — LIDOCAINE 2% (20 MG/ML) 5 ML SYRINGE
INTRAMUSCULAR | Status: AC
  Filled 2016-06-22: qty 5

## 2016-06-22 MED ORDER — DEXAMETHASONE SODIUM PHOSPHATE 10 MG/ML IJ SOLN
INTRAMUSCULAR | Status: DC | PRN
  Administered 2016-06-22: 10 mg via INTRAVENOUS

## 2016-06-22 MED ORDER — KCL IN DEXTROSE-NACL 20-5-0.45 MEQ/L-%-% IV SOLN
INTRAVENOUS | Status: DC
  Administered 2016-06-22 – 2016-06-23 (×3): via INTRAVENOUS
  Filled 2016-06-22 (×3): qty 1000

## 2016-06-22 MED ORDER — FENTANYL CITRATE (PF) 250 MCG/5ML IJ SOLN
INTRAMUSCULAR | Status: AC
  Filled 2016-06-22: qty 5

## 2016-06-22 MED ORDER — PROPOFOL 10 MG/ML IV BOLUS
INTRAVENOUS | Status: DC | PRN
  Administered 2016-06-22: 200 mg via INTRAVENOUS

## 2016-06-22 MED ORDER — HEPARIN SODIUM (PORCINE) 1000 UNIT/ML IJ SOLN
INTRAMUSCULAR | Status: AC
Start: 2016-06-22 — End: 2016-06-22
  Filled 2016-06-22: qty 1

## 2016-06-22 MED ORDER — ACETAMINOPHEN 325 MG PO TABS: 650.0000 mg | ORAL_TABLET | ORAL | Status: DC | PRN

## 2016-06-22 MED ORDER — ONDANSETRON HCL 4 MG/2ML IJ SOLN
INTRAMUSCULAR | Status: AC
  Filled 2016-06-22: qty 2

## 2016-06-22 MED ORDER — KETOROLAC TROMETHAMINE 15 MG/ML IJ SOLN: 15.0000 mg | Freq: Four times a day (QID) | INTRAMUSCULAR | Status: DC

## 2016-06-22 MED ORDER — CEFAZOLIN IN D5W 1 GM/50ML IV SOLN
1.0000 g | Freq: Three times a day (TID) | INTRAVENOUS | Status: DC
  Filled 2016-06-22 (×2): qty 50

## 2016-06-22 MED ORDER — DIPHENHYDRAMINE HCL 12.5 MG/5ML PO ELIX: 12.5000 mg | ORAL_SOLUTION | Freq: Four times a day (QID) | ORAL | Status: DC | PRN

## 2016-06-22 MED ORDER — MIDAZOLAM HCL 2 MG/2ML IJ SOLN: 0.5000 mg | Freq: Once | INTRAMUSCULAR | Status: DC | PRN

## 2016-06-22 MED ORDER — DIPHENHYDRAMINE HCL 50 MG/ML IJ SOLN: 12.5000 mg | Freq: Four times a day (QID) | INTRAMUSCULAR | Status: DC | PRN

## 2016-06-22 MED ORDER — MORPHINE SULFATE (PF) 4 MG/ML IV SOLN: 2.0000 mg | INTRAVENOUS | Status: DC | PRN

## 2016-06-22 MED ORDER — ONDANSETRON HCL 4 MG/2ML IJ SOLN
INTRAMUSCULAR | Status: DC | PRN
  Administered 2016-06-22: 4 mg via INTRAVENOUS

## 2016-06-22 MED ORDER — HYDROMORPHONE HCL 1 MG/ML IJ SOLN: 0.5000 mg | INTRAMUSCULAR | Status: DC | PRN

## 2016-06-22 MED ORDER — HYDROMORPHONE HCL 2 MG/ML IJ SOLN
INTRAMUSCULAR | Status: AC
  Filled 2016-06-22: qty 1

## 2016-06-22 MED ORDER — PROPOFOL 10 MG/ML IV BOLUS
INTRAVENOUS | Status: AC
  Filled 2016-06-22: qty 20

## 2016-06-22 MED ORDER — SUGAMMADEX SODIUM 200 MG/2ML IV SOLN
INTRAVENOUS | Status: DC | PRN
  Administered 2016-06-22: 200 mg via INTRAVENOUS

## 2016-06-22 MED ORDER — MIDAZOLAM HCL 5 MG/5ML IJ SOLN
INTRAMUSCULAR | Status: DC | PRN
  Administered 2016-06-22: 2 mg via INTRAVENOUS

## 2016-06-22 MED ORDER — LACTATED RINGERS IV SOLN
INTRAVENOUS | Status: DC | PRN
  Administered 2016-06-22 (×2): via INTRAVENOUS

## 2016-06-22 MED ORDER — CEFAZOLIN SODIUM-DEXTROSE 2-4 GM/100ML-% IV SOLN
2.0000 g | INTRAVENOUS | Status: AC
  Administered 2016-06-22: 2 g via INTRAVENOUS

## 2016-06-22 MED ORDER — HYDROCODONE-ACETAMINOPHEN 5-325 MG PO TABS: 1.0000 | ORAL_TABLET | Freq: Four times a day (QID) | ORAL | 0 refills | Status: DC | PRN

## 2016-06-22 MED ORDER — DOCUSATE SODIUM 100 MG PO CAPS
100.0000 mg | ORAL_CAPSULE | Freq: Two times a day (BID) | ORAL | Status: DC
  Administered 2016-06-22: 100 mg via ORAL

## 2016-06-22 MED ORDER — CEFAZOLIN IN D5W 1 GM/50ML IV SOLN
1.0000 g | Freq: Three times a day (TID) | INTRAVENOUS | Status: AC
  Administered 2016-06-22 (×2): 1 g via INTRAVENOUS
  Filled 2016-06-22 (×2): qty 50

## 2016-06-22 MED ORDER — SUGAMMADEX SODIUM 200 MG/2ML IV SOLN
INTRAVENOUS | Status: AC
  Filled 2016-06-22: qty 2

## 2016-06-22 MED ORDER — DOCUSATE SODIUM 100 MG PO CAPS
100.0000 mg | ORAL_CAPSULE | Freq: Two times a day (BID) | ORAL | Status: DC
  Administered 2016-06-22 – 2016-06-23 (×2): 100 mg via ORAL
  Filled 2016-06-22 (×3): qty 1

## 2016-06-22 MED ORDER — KCL IN DEXTROSE-NACL 20-5-0.45 MEQ/L-%-% IV SOLN: INTRAVENOUS | Status: DC

## 2016-06-22 MED ORDER — FENTANYL CITRATE (PF) 100 MCG/2ML IJ SOLN
INTRAMUSCULAR | Status: DC | PRN
  Administered 2016-06-22 (×2): 100 ug via INTRAVENOUS
  Administered 2016-06-22: 50 ug via INTRAVENOUS

## 2016-06-22 MED ORDER — OXYBUTYNIN CHLORIDE 5 MG PO TABS: 5.0000 mg | ORAL_TABLET | Freq: Three times a day (TID) | ORAL | Status: DC | PRN

## 2016-06-22 MED ORDER — SODIUM CHLORIDE 0.9 % IV BOLUS (SEPSIS)
1000.0000 mL | Freq: Once | INTRAVENOUS | Status: AC
  Administered 2016-06-22: 1000 mL via INTRAVENOUS

## 2016-06-22 MED ORDER — DEXAMETHASONE SODIUM PHOSPHATE 10 MG/ML IJ SOLN
INTRAMUSCULAR | Status: AC
Start: 2016-06-22 — End: 2016-06-22
  Filled 2016-06-22: qty 1

## 2016-06-22 MED ORDER — ONDANSETRON HCL 4 MG/2ML IJ SOLN: 4.0000 mg | INTRAMUSCULAR | Status: DC | PRN

## 2016-06-22 MED ORDER — STERILE WATER FOR IRRIGATION IR SOLN
Status: DC | PRN
  Administered 2016-06-22: 1000 mL

## 2016-06-22 MED ORDER — CEFAZOLIN SODIUM-DEXTROSE 2-4 GM/100ML-% IV SOLN
INTRAVENOUS | Status: AC
Start: 2016-06-22 — End: 2016-06-22
  Filled 2016-06-22: qty 100

## 2016-06-22 MED ORDER — LIDOCAINE HCL (CARDIAC) 20 MG/ML IV SOLN
INTRAVENOUS | Status: DC | PRN
  Administered 2016-06-22: 100 mg via INTRAVENOUS

## 2016-06-22 MED ORDER — HYDROMORPHONE HCL 1 MG/ML IJ SOLN
INTRAMUSCULAR | Status: DC | PRN
  Administered 2016-06-22 (×2): .4 mg via INTRAVENOUS

## 2016-06-22 MED ORDER — SODIUM CHLORIDE 0.9 % IV BOLUS (SEPSIS): 1000.0000 mL | Freq: Once | INTRAVENOUS | Status: AC

## 2016-06-22 MED ORDER — BUPIVACAINE-EPINEPHRINE 0.25% -1:200000 IJ SOLN
INTRAMUSCULAR | Status: DC | PRN
  Administered 2016-06-22: 26 mL

## 2016-06-22 MED ORDER — SENNA 8.6 MG PO TABS
1.0000 | ORAL_TABLET | Freq: Two times a day (BID) | ORAL | Status: DC
  Administered 2016-06-22 – 2016-06-23 (×3): 8.6 mg via ORAL
  Filled 2016-06-22 (×3): qty 1

## 2016-06-22 MED ORDER — KETOROLAC TROMETHAMINE 15 MG/ML IJ SOLN
15.0000 mg | Freq: Four times a day (QID) | INTRAMUSCULAR | Status: DC
  Administered 2016-06-22 – 2016-06-23 (×4): 15 mg via INTRAVENOUS
  Filled 2016-06-22 (×4): qty 1

## 2016-06-22 SURGICAL SUPPLY — 53 items
ADH SKN CLS APL DERMABOND .7 (GAUZE/BANDAGES/DRESSINGS)
APPLICATOR COTTON TIP 6IN STRL (MISCELLANEOUS) ×3 IMPLANT
CATH FOLEY 2WAY SLVR 18FR 30CC (CATHETERS) ×3 IMPLANT
CATH ROBINSON RED A/P 16FR (CATHETERS) ×3 IMPLANT
CATH ROBINSON RED A/P 8FR (CATHETERS) ×3 IMPLANT
CATH TIEMANN FOLEY 18FR 5CC (CATHETERS) ×3 IMPLANT
CHLORAPREP W/TINT 26ML (MISCELLANEOUS) ×1 IMPLANT
CLIP LIGATING HEM O LOK PURPLE (MISCELLANEOUS) ×6 IMPLANT
COVER SURGICAL LIGHT HANDLE (MISCELLANEOUS) ×3 IMPLANT
COVER TIP SHEARS 8 DVNC (MISCELLANEOUS) ×1 IMPLANT
COVER TIP SHEARS 8MM DA VINCI (MISCELLANEOUS) ×2
CUTTER ECHEON FLEX ENDO 45 340 (ENDOMECHANICALS) ×3 IMPLANT
DECANTER SPIKE VIAL GLASS SM (MISCELLANEOUS) ×1 IMPLANT
DERMABOND ADVANCED (GAUZE/BANDAGES/DRESSINGS)
DERMABOND ADVANCED .7 DNX12 (GAUZE/BANDAGES/DRESSINGS) IMPLANT
DRAPE ARM DVNC X/XI (DISPOSABLE) ×4 IMPLANT
DRAPE COLUMN DVNC XI (DISPOSABLE) ×1 IMPLANT
DRAPE DA VINCI XI ARM (DISPOSABLE) ×8
DRAPE DA VINCI XI COLUMN (DISPOSABLE) ×2
DRAPE SURG IRRIG POUCH 19X23 (DRAPES) ×3 IMPLANT
DRSG TEGADERM 4X4.75 (GAUZE/BANDAGES/DRESSINGS) ×3 IMPLANT
ELECT REM PT RETURN 9FT ADLT (ELECTROSURGICAL) ×3
ELECTRODE REM PT RTRN 9FT ADLT (ELECTROSURGICAL) ×1 IMPLANT
GLOVE BIO SURGEON STRL SZ 6.5 (GLOVE) ×2 IMPLANT
GLOVE BIO SURGEONS STRL SZ 6.5 (GLOVE) ×1
GLOVE BIOGEL M STRL SZ7.5 (GLOVE) ×6 IMPLANT
GOWN STRL REUS W/TWL LRG LVL3 (GOWN DISPOSABLE) ×9 IMPLANT
HOLDER FOLEY CATH W/STRAP (MISCELLANEOUS) ×3 IMPLANT
IRRIG SUCT STRYKERFLOW 2 WTIP (MISCELLANEOUS) ×3
IRRIGATION SUCT STRKRFLW 2 WTP (MISCELLANEOUS) ×1 IMPLANT
IV LACTATED RINGERS 1000ML (IV SOLUTION) ×1 IMPLANT
NDL SAFETY ECLIPSE 18X1.5 (NEEDLE) ×1 IMPLANT
NEEDLE HYPO 18GX1.5 SHARP (NEEDLE) ×3
PACK ROBOT UROLOGY CUSTOM (CUSTOM PROCEDURE TRAY) ×3 IMPLANT
RELOAD GREEN ECHELON 45 (STAPLE) ×3 IMPLANT
SEAL CANN UNIV 5-8 DVNC XI (MISCELLANEOUS) ×4 IMPLANT
SEAL XI 5MM-8MM UNIVERSAL (MISCELLANEOUS) ×8
SOLUTION ELECTROLUBE (MISCELLANEOUS) ×3 IMPLANT
SUT ETHILON 3 0 PS 1 (SUTURE) ×3 IMPLANT
SUT MNCRL 3 0 RB1 (SUTURE) ×1 IMPLANT
SUT MNCRL 3 0 VIOLET RB1 (SUTURE) ×1 IMPLANT
SUT MNCRL AB 4-0 PS2 18 (SUTURE) ×6 IMPLANT
SUT MONOCRYL 3 0 RB1 (SUTURE) ×4
SUT VIC AB 0 CT1 27 (SUTURE) ×3
SUT VIC AB 0 CT1 27XBRD ANTBC (SUTURE) ×1 IMPLANT
SUT VIC AB 0 UR5 27 (SUTURE) ×3 IMPLANT
SUT VIC AB 2-0 SH 27 (SUTURE) ×3
SUT VIC AB 2-0 SH 27X BRD (SUTURE) ×1 IMPLANT
SUT VICRYL 0 UR6 27IN ABS (SUTURE) ×6 IMPLANT
SYR 27GX1/2 1ML LL SAFETY (SYRINGE) ×3 IMPLANT
TOWEL OR 17X26 10 PK STRL BLUE (TOWEL DISPOSABLE) ×3 IMPLANT
TOWEL OR NON WOVEN STRL DISP B (DISPOSABLE) ×3 IMPLANT
WATER STERILE IRR 1500ML POUR (IV SOLUTION) ×2 IMPLANT

## 2016-06-22 NOTE — Transfer of Care (Signed)
Immediate Anesthesia Transfer of Care Note  Patient: Paul Potter  Procedure(s) Performed: Procedure(s): ROBOTIC ASSISTED LAPAROSCOPIC RADICAL PROSTATECTOMY LEVEL 2/ BILATERAL PELVIC LYMPHADENECTOMY (N/A)  Patient Location: PACU  Anesthesia Type:General  Level of Consciousness: awake, alert  and oriented  Airway & Oxygen Therapy: Patient Spontanous Breathing and Patient connected to face mask oxygen  Post-op Assessment: Report given to RN and Post -op Vital signs reviewed and stable  Post vital signs: Reviewed and stable  Last Vitals:  Vitals:   06/22/16 0507  BP: (!) 143/80  Pulse: 95  Resp: 18  Temp: 36.9 C    Last Pain:  Vitals:   06/22/16 0507  TempSrc: Oral      Patients Stated Pain Goal: 4 (01/00/71 2197)  Complications: No apparent anesthesia complications

## 2016-06-22 NOTE — Discharge Instructions (Signed)

## 2016-06-22 NOTE — Anesthesia Procedure Notes (Signed)
Procedure Name: Intubation Date/Time: 06/22/2016 7:24 AM Performed by: Glory Buff Pre-anesthesia Checklist: Patient identified, Emergency Drugs available, Suction available and Patient being monitored Patient Re-evaluated:Patient Re-evaluated prior to inductionOxygen Delivery Method: Circle system utilized Preoxygenation: Pre-oxygenation with 100% oxygen Intubation Type: IV induction Ventilation: Mask ventilation without difficulty Laryngoscope Size: Miller and 3 Grade View: Grade II Tube type: Oral Tube size: 7.5 mm Number of attempts: 1 Airway Equipment and Method: Stylet and Oral airway Placement Confirmation: ETT inserted through vocal cords under direct vision,  positive ETCO2 and breath sounds checked- equal and bilateral Secured at: 21 cm Tube secured with: Tape Dental Injury: Teeth and Oropharynx as per pre-operative assessment

## 2016-06-22 NOTE — Progress Notes (Signed)
Post-op note  Subjective: The patient is doing well.  No complaints.  Objective: Vital signs in last 24 hours: Temp:  [97.1 F (36.2 C)-98.5 F (36.9 C)] 97.6 F (36.4 C) (03/12 1300) Pulse Rate:  [95-106] 106 (03/12 1300) Resp:  [13-23] 14 (03/12 1300) BP: (119-166)/(61-82) 134/72 (03/12 1300) SpO2:  [93 %-100 %] 95 % (03/12 1300) Weight:  [101.6 kg (224 lb)] 101.6 kg (224 lb) (03/12 0512)  Intake/Output from previous day: No intake/output data recorded. Intake/Output this shift: Total I/O In: 3050 [I.V.:2000; IV Piggyback:1050] Out: 430 [Urine:250; Drains:80; Blood:100]  Physical Exam:  General: Alert and oriented. Abdomen: Soft, Nondistended. Incisions: Clean and dry. Urine: yellow  Lab Results:  Recent Labs  06/22/16 1052  HGB 14.8  HCT 44.2    Assessment/Plan: POD#0   1) Continue to monitor  2) DVT prophy, clears, IS, amb, pain control   LOS: 0 days   Paul Potter 06/22/2016, 4:17 PM

## 2016-06-22 NOTE — Anesthesia Postprocedure Evaluation (Signed)
Anesthesia Post Note  Patient: Paul Potter  Procedure(s) Performed: Procedure(s) (LRB): ROBOTIC ASSISTED LAPAROSCOPIC RADICAL PROSTATECTOMY LEVEL 2/ BILATERAL PELVIC LYMPHADENECTOMY (N/A)  Patient location during evaluation: PACU Anesthesia Type: General Level of consciousness: awake and alert, oriented and patient cooperative Pain management: pain level controlled Vital Signs Assessment: post-procedure vital signs reviewed and stable Respiratory status: spontaneous breathing, nonlabored ventilation, respiratory function stable and patient connected to nasal cannula oxygen Cardiovascular status: blood pressure returned to baseline and stable Postop Assessment: no signs of nausea or vomiting Anesthetic complications: no       Last Vitals:  Vitals:   06/22/16 1130 06/22/16 1150  BP: (!) 144/80 (!) 156/76  Pulse: (!) 102 (!) 104  Resp: 14 13  Temp: 36.8 C 36.6 C    Last Pain:  Vitals:   06/22/16 1130  TempSrc:   PainSc: 3                  Roch Quach,E. Loanne Emery

## 2016-06-22 NOTE — Anesthesia Preprocedure Evaluation (Addendum)
Anesthesia Evaluation  Patient identified by MRN, date of birth, ID band Patient awake    Reviewed: Allergy & Precautions, NPO status , Patient's Chart, lab work & pertinent test results  History of Anesthesia Complications Negative for: history of anesthetic complications  Airway Mallampati: II  TM Distance: >3 FB Neck ROM: Full    Dental  (+) Dental Advisory Given   Pulmonary asthma (no inhaler needed in over a year) ,    breath sounds clear to auscultation       Cardiovascular (-) anginanegative cardio ROS   Rhythm:Regular Rate:Normal     Neuro/Psych negative neurological ROS     GI/Hepatic Neg liver ROS, hiatal hernia, neg GERD  ,  Endo/Other  Morbid obesity  Renal/GU negative Renal ROS   Prostate cancer    Musculoskeletal   Abdominal (+) + obese,   Peds  Hematology negative hematology ROS (+)   Anesthesia Other Findings   Reproductive/Obstetrics                            Anesthesia Physical Anesthesia Plan  ASA: II  Anesthesia Plan: General   Post-op Pain Management:    Induction: Intravenous  Airway Management Planned: Oral ETT  Additional Equipment:   Intra-op Plan:   Post-operative Plan: Extubation in OR  Informed Consent: I have reviewed the patients History and Physical, chart, labs and discussed the procedure including the risks, benefits and alternatives for the proposed anesthesia with the patient or authorized representative who has indicated his/her understanding and acceptance.   Dental advisory given  Plan Discussed with: CRNA and Surgeon  Anesthesia Plan Comments: (Plan routine monitors, GETA)        Anesthesia Quick Evaluation

## 2016-06-22 NOTE — Discharge Summary (Signed)
Physician Discharge Summary   Date of admission: 06/22/2016  Date of discharge: 06/23/2016  Admission diagnosis: Prostate Cancer  Discharge diagnosis: Prostate Cancer  History and Physical: For full details, please see admission history and physical. Briefly, Paul Potter is a 56 y.o. gentleman with localized prostate cancer.  After discussing management/treatment options, he elected to proceed with surgical treatment.  Hospital Course: Paul Potter was taken to the operating room on 06/22/2016 and underwent a robotic assisted laparoscopic radical prostatectomy. He tolerated this procedure well and without complications. Postoperatively, he was able to be transferred to a regular hospital room following recovery from anesthesia.  He was able to begin ambulating the night of surgery. He remained hemodynamically stable overnight.  He had excellent urine output with appropriately minimal output from his pelvic drain and his pelvic drain was removed on POD #1.  He was transitioned to oral pain medication, tolerated a clear liquid diet, and had met all discharge criteria and was able to be discharged home later on POD#1.  Laboratory values:  Recent Labs  06/22/16 1052 06/23/16 0538  HGB 14.8 13.5  HCT 44.2 39.8    Disposition: Home  Discharge instruction: He was instructed to be ambulatory but to refrain from heavy lifting, strenuous activity, or driving. He was instructed on urethral catheter care.  Discharge medications:  Allergies as of 06/23/2016      Reactions   Hydrocodone Itching   Chlorhexidine Rash, Other (See Comments)   Burned skin   Codeine Itching      Medication List    STOP taking these medications   Fish Oil 1000 MG Caps   Ginger 500 MG Caps   MULTIVITAMIN & MINERAL PO   OVER THE COUNTER MEDICATION   vitamin B-12 1000 MCG tablet Commonly known as:  CYANOCOBALAMIN   vitamin C 1000 MG tablet   Vitamin D 2000 units tablet     TAKE these medications    diphenhydrAMINE 25 MG tablet Commonly known as:  BENADRYL Take 25 mg by mouth every 6 (six) hours as needed.   docusate sodium 100 MG capsule Commonly known as:  COLACE Take 1 capsule (100 mg total) by mouth 2 (two) times daily.   sulfamethoxazole-trimethoprim 800-160 MG tablet Commonly known as:  BACTRIM DS,SEPTRA DS Take 1 tablet by mouth 2 (two) times daily. Start the day prior to foley removal appointment   traMADol 50 MG tablet Commonly known as:  ULTRAM Take 1 tablet (50 mg total) by mouth every 6 (six) hours as needed for moderate pain.       Followup: He will followup in 1 week for catheter removal and to discuss his surgical pathology results.

## 2016-06-22 NOTE — Op Note (Signed)
Preoperative diagnosis: Clinically localized adenocarcinoma of the prostate (clinical stage T2c N0 M0)  Postoperative diagnosis: Clinically localized adenocarcinoma of the prostate (clinical stage T2c N0 M0)  Procedure:  1. Robotic assisted laparoscopic radical prostatectomy (right nerve sparing) 2. Bilateral robotic assisted laparoscopic pelvic lymphadenectomy  Surgeon: Pryor Curia. M.D.  Assistant(s): Debbrah Alar, PA-C  I discussed the potential benefits and risks of the procedure, side effects of the proposed treatment, the likelihood of the patient achieving the goals of the procedure, and any potential problems that might occur during the procedure or recuperation.   Resident: Dr. Cleotis Lema  Anesthesia: General  Complications: None  EBL: 100 mL  IVF:  1800 mL crystalloid  Specimens: 1. Prostate and seminal vesicles 2. Right pelvic lymph nodes 3. Left pelvic lymph nodes  Disposition of specimens: Pathology  Drains: 1. 20 Fr coude catheter 2. # 19 Blake pelvic drain  Indication: Paul Potter is a 56 y.o. patient with clinically localized prostate cancer.  After a thorough review of the management options for treatment of prostate cancer, he elected to proceed with surgical therapy and the above procedure(s).  We have discussed the potential benefits and risks of the procedure, side effects of the proposed treatment, the likelihood of the patient achieving the goals of the procedure, and any potential problems that might occur during the procedure or recuperation. Informed consent has been obtained.  Description of procedure:  The patient was taken to the operating room and a general anesthetic was administered. He was given preoperative antibiotics, placed in the dorsal lithotomy position, and prepped and draped in the usual sterile fashion. Next a preoperative timeout was performed. A urethral catheter was placed into the bladder and a site was selected near  the umbilicus for placement of the camera port. This was placed using a standard open Hassan technique which allowed entry into the peritoneal cavity under direct vision and without difficulty. An 8 mm port was placed and a pneumoperitoneum established. The camera was then used to inspect the abdomen and there was no evidence of any intra-abdominal injuries or other abnormalities. The remaining abdominal ports were then placed. 8 mm robotic ports were placed in the right lower quadrant, left lower quadrant, and far left lateral abdominal wall. A 5 mm port was placed in the right upper quadrant and a 12 mm port was placed in the right lateral abdominal wall for laparoscopic assistance. All ports were placed under direct vision without difficulty. The surgical cart was then docked.   Utilizing the cautery scissors, the bladder was reflected posteriorly allowing entry into the space of Retzius and identification of the endopelvic fascia and prostate. The periprostatic fat was then removed from the prostate allowing full exposure of the endopelvic fascia. The endopelvic fascia was then incised from the apex back to the base of the prostate bilaterally and the underlying levator muscle fibers were swept laterally off the prostate thereby isolating the dorsal venous complex. The dorsal vein was then stapled and divided with a 45 mm Flex Echelon stapler. Attention then turned to the bladder neck which was divided anteriorly thereby allowing entry into the bladder and exposure of the urethral catheter. The catheter balloon was deflated and the catheter was brought into the operative field and used to retract the prostate anteriorly. The posterior bladder neck was then examined and was divided allowing further dissection between the bladder and prostate posteriorly until the vasa deferentia and seminal vessels were identified. The vasa deferentia were isolated, divided,  and lifted anteriorly. The seminal vesicles were  dissected down to their tips with care to control the seminal vascular arterial blood supply. These structures were then lifted anteriorly and the space between Denonvillier's fascia and the anterior rectum was developed with a combination of sharp and blunt dissection. This isolated the vascular pedicles of the prostate.  The lateral prostatic fascia on the right side of the prostate was then sharply incised allowing release of the neurovascular bundle. The vascular pedicle of the prostate on the right side was then ligated with Weck clips between the prostate and neurovascular bundle and divided with sharp cold scissor dissection resulting in neurovascular bundle preservation. On the left side, a wide non nerve sparing dissection was performed with Weck clips used to ligate the vascular pedicle of the prostate. The neurovascular bundle on the right side was then separated off the apex of the prostate and urethra.  The urethra was then sharply transected allowing the prostate specimen to be disarticulated. The pelvis was copiously irrigated and hemostasis was ensured. There was no evidence for rectal injury.  Attention then turned to the right pelvic sidewall. An extended dissection was performed. The fibrofatty tissue between the genitofemoral nerve, confluence of the iliac vessels, hypogastric artery, and Cooper's ligament was dissected free from the pelvic sidewall with care to preserve the obturator nerve. Weck clips were used for lymphostasis and hemostasis. An identical procedure was performed on the contralateral side and the lymphatic packets were removed for permanent pathologic analysis.  Attention then turned to the urethral anastomosis. A 2-0 Vicryl slip knot was placed between Denonvillier's fascia, the posterior bladder neck, and the posterior urethra to reapproximate these structures. A double-armed 3-0 Monocryl suture was then used to perform a 360 running tension-free anastomosis between  the bladder neck and urethra. A new urethral catheter was then placed into the bladder and irrigated. There were no blood clots within the bladder and the anastomosis appeared to be watertight. A #19 Blake drain was then brought through the left lateral 8 mm port site and positioned appropriately within the pelvis. It was secured to the skin with a nylon suture. The surgical cart was then undocked. The right lateral 12 mm port site was closed at the fascial level with a 0 Vicryl suture placed laparoscopically. All remaining ports were then removed under direct vision. The prostate specimen was removed intact within the Endopouch retrieval bag via the periumbilical camera port site. This fascial opening was closed with two running 0 Vicryl sutures. 0.25% Marcaine was then injected into all port sites and all incisions were reapproximated at the skin level with 4-0 Monocryl subcuticular sutures. Liquiband was applied. The patient appeared to tolerate the procedure well and without complications. The patient was able to be extubated and transferred to the recovery unit in satisfactory condition.   Pryor Curia MD

## 2016-06-23 ENCOUNTER — Encounter (HOSPITAL_COMMUNITY): Payer: Self-pay | Admitting: Urology

## 2016-06-23 DIAGNOSIS — C61 Malignant neoplasm of prostate: Secondary | ICD-10-CM | POA: Diagnosis not present

## 2016-06-23 LAB — HEMOGLOBIN AND HEMATOCRIT, BLOOD
HCT: 39.8 % (ref 39.0–52.0)
HEMOGLOBIN: 13.5 g/dL (ref 13.0–17.0)

## 2016-06-23 MED ORDER — BISACODYL 10 MG RE SUPP
10.0000 mg | Freq: Once | RECTAL | Status: AC
  Administered 2016-06-23: 10 mg via RECTAL
  Filled 2016-06-23: qty 1

## 2016-06-23 MED ORDER — DOCUSATE SODIUM 100 MG PO CAPS: 100.0000 mg | ORAL_CAPSULE | Freq: Two times a day (BID) | ORAL | 0 refills | Status: DC

## 2016-06-23 MED ORDER — TRAMADOL HCL 50 MG PO TABS: 50.0000 mg | ORAL_TABLET | Freq: Four times a day (QID) | ORAL | Status: DC | PRN

## 2016-06-23 MED ORDER — TRAMADOL HCL 50 MG PO TABS: 50.0000 mg | ORAL_TABLET | Freq: Four times a day (QID) | ORAL | 0 refills | Status: DC | PRN

## 2016-06-23 NOTE — Progress Notes (Signed)
UROLOGY PROGRESS NOTES  Assessment/Plan: Paul Potter is a 56 y.o. male with a history of asthma, prostate cancer who is s/p RALP and bilateral PLND on 06/22/16 with Dr. Alinda Money.  Interval/Plan: Doing very well postop. Afebrile. Tachycardic last evening, now HR 86. Normotensive (to slightly hypertensive). 5.5 UOP, 3.7 overnight. 190 total from Jp. Hb 13.5.  - Saline lock - Continue clears - Suppository x1 - Consider JP Cr - OOB/ambulate/IS - Trend JP output. Likely remove later today - Likely d/c later today   Subjective: Doing very well. Minimal pain. Ambulating. Passing flatus. No n/v. Tolerating clears.   Objective:  Vital signs in last 24 hours: Temp:  [97.1 F (36.2 C)-98.3 F (36.8 C)] 98.2 F (36.8 C) (03/13 0610) Pulse Rate:  [83-106] 83 (03/13 0610) Resp:  [13-23] 18 (03/13 0610) BP: (119-166)/(61-82) 120/65 (03/13 0610) SpO2:  [93 %-100 %] 99 % (03/13 0610)  03/12 0701 - 03/13 0700 In: 5365 [I.V.:4315; IV Piggyback:1050] Out: 5840 [Urine:5550; Drains:190; Blood:100]    Physical Exam:  General:  well-developed and well-nourished male in NAD, lying in bed, alert & oriented, pleasant HEENT: [place]/AT, EOMI, sclera anicteric, hearing grossly intact, no nasal discharge, MMM Respiratory: nonlabored respirations, satting well on RA, symmetrical chest rise Cardiovascular: pulse regular rate & rhythm Abdominal: soft, NTTP, nondistended, surgical incisions c/d/i without signs of exudate/erythema GU: Foley draining clear yellow urine, JP with SS drainage Extremities: warm, well-perfused, no c/c/e Neuro: no focal deficits   Data Review: Results for orders placed or performed during the hospital encounter of 06/22/16 (from the past 24 hour(s))  Hemoglobin and hematocrit, blood     Status: None   Collection Time: 06/22/16 10:52 AM  Result Value Ref Range   Hemoglobin 14.8 13.0 - 17.0 g/dL   HCT 44.2 39.0 - 52.0 %  Hemoglobin and hematocrit, blood     Status: None   Collection Time: 06/23/16  5:38 AM  Result Value Ref Range   Hemoglobin 13.5 13.0 - 17.0 g/dL   HCT 39.8 39.0 - 52.0 %    Imaging: None

## 2016-06-23 NOTE — Progress Notes (Signed)
Completed D/C teaching. Demonstrated foley care. Discussed medications. Patient will be D/C home with family in stable condition.

## 2016-08-24 ENCOUNTER — Telehealth: Payer: Self-pay | Admitting: Medical Oncology

## 2016-08-24 NOTE — Progress Notes (Signed)
I called pt to introduce myself as the Prostate Nurse Navigator and the Coordinator of the Prostate [place].  1. I confirmed with the patient he is aware of his referral to the clinic 5/22/158 arriving at 12:00 pm.  2. I discussed the format of the clinic and the physicians he will be seeing that day.  3. I discussed where the clinic is located and how to contact me.  4. I confirmed his address and informed him I would be mailing a packet of information and forms to be completed. I asked him to bring them with him the day of his appointment.   He voiced understanding of the above. I asked him to call me if he has any questions or concerns regarding his appointments or the forms he needs to complete.

## 2016-08-27 ENCOUNTER — Encounter: Payer: Self-pay | Admitting: Medical Oncology

## 2016-08-27 NOTE — Progress Notes (Signed)
Requested pathology slides from [facility])  for Prostate [facility].

## 2016-08-31 ENCOUNTER — Encounter: Payer: Self-pay | Admitting: Radiation Oncology

## 2016-08-31 ENCOUNTER — Telehealth: Payer: Self-pay | Admitting: Medical Oncology

## 2016-08-31 NOTE — Progress Notes (Signed)
GU Location of Tumor / Histology: Prostatic Adenocarcinoma  If Prostate Cancer, Gleason Score is (4 + 5) on 03/16/2016 and PSA is (4.03) on 12/13/15 while on finasteride  Fuller Song presented to Dr. Pilar Jarvis for routine PSA screening  In 2015 and again in 2017.    Biopsies of Prostate on 37/29/0211 (if applicable) revealed:       Past/Anticipated interventions by urology, if any: Radical Prostatectomy 06/22/2016 Dr. Alinda Money, Prostate needle biopsy 03/16/2016 Dr. Pilar Jarvis  Past/Anticipated interventions by medical oncology, if any: none  Weight changes, if any: no  Bowel/Bladder complaints, if any: Nocturia X 3; IPSS is 10, SHIM score is 21  Nausea/Vomiting, if any: no  Pain issues, if any:  no  SAFETY ISSUES:  Prior radiation? No  Pacemaker/ICD? no  Possible current pregnancy? no  Is the patient on methotrexate? no  Current Complaints / other details:  Mr. Joseh Sjogren; married, occupation Press photographer at [employer].

## 2016-08-31 NOTE — Progress Notes (Signed)
Confirmed appointment for Prostate [facility] 09/01/16 with arrival at 12:30 pm.  Reminded him to bring his completed medical forms and to have lunch before his arrival. He voiced understanding.

## 2016-09-01 ENCOUNTER — Ambulatory Visit (HOSPITAL_BASED_OUTPATIENT_CLINIC_OR_DEPARTMENT_OTHER): Payer: Commercial Managed Care - PPO | Admitting: Oncology

## 2016-09-01 ENCOUNTER — Encounter: Payer: Self-pay | Admitting: Medical Oncology

## 2016-09-01 ENCOUNTER — Ambulatory Visit
Admission: RE | Admit: 2016-09-01 | Discharge: 2016-09-01 | Disposition: A | Discharge status: Home / Self Care | Payer: Commercial Managed Care - PPO | Source: Ambulatory Visit | Attending: Radiation Oncology | Admitting: Radiation Oncology

## 2016-09-01 VITALS — BP 153/88 | HR 97 | Resp 16 | Ht 67.5 in | Wt 221.8 lb

## 2016-09-01 DIAGNOSIS — Z825 Family history of asthma and other chronic lower respiratory diseases: Secondary | ICD-10-CM | POA: Diagnosis not present

## 2016-09-01 DIAGNOSIS — N5201 Erectile dysfunction due to arterial insufficiency: Secondary | ICD-10-CM | POA: Insufficient documentation

## 2016-09-01 DIAGNOSIS — Z9079 Acquired absence of other genital organ(s): Secondary | ICD-10-CM | POA: Diagnosis not present

## 2016-09-01 DIAGNOSIS — Z79899 Other long term (current) drug therapy: Secondary | ICD-10-CM | POA: Diagnosis not present

## 2016-09-01 DIAGNOSIS — Z806 Family history of leukemia: Secondary | ICD-10-CM | POA: Insufficient documentation

## 2016-09-01 DIAGNOSIS — Z803 Family history of malignant neoplasm of breast: Secondary | ICD-10-CM | POA: Diagnosis not present

## 2016-09-01 DIAGNOSIS — C779 Secondary and unspecified malignant neoplasm of lymph node, unspecified: Secondary | ICD-10-CM

## 2016-09-01 DIAGNOSIS — N393 Stress incontinence (female) (male): Secondary | ICD-10-CM | POA: Insufficient documentation

## 2016-09-01 DIAGNOSIS — C61 Malignant neoplasm of prostate: Secondary | ICD-10-CM | POA: Diagnosis not present

## 2016-09-01 DIAGNOSIS — J45909 Unspecified asthma, uncomplicated: Secondary | ICD-10-CM | POA: Insufficient documentation

## 2016-09-01 DIAGNOSIS — Z510 Encounter for antineoplastic radiation therapy: Secondary | ICD-10-CM | POA: Insufficient documentation

## 2016-09-01 DIAGNOSIS — Z885 Allergy status to narcotic agent status: Secondary | ICD-10-CM | POA: Diagnosis not present

## 2016-09-01 NOTE — Consult Note (Signed)
Multi-Disciplinary Clinic     09/01/2016   --------------------------------------------------------------------------------   Paul Potter  MRN: [id number]  PRIMARY CARE:  Priscille Heidelberg. Little, MD  DOB: 1960/09/18, 56 year old Male  REFERRING:  Raynelle Bring, MD  SSN:-[id number]  PROVIDER:  Baruch Gouty, M.D.    TREATING:  Raynelle Bring, M.D.    LOCATION:  [facility] [phone]   --------------------------------------------------------------------------------   CC/HPI: Prostate cancer   Location of consultation: [facility]    Paul Potter is a 56 year old gentleman who is s/p a UNS RAL radical prostatectomy and BPLND on 06/22/16 that demonstrated a pT3b N1 Mx, Gleason 4+5=9 adenocarcinoma with multiple positive surgical margins (2/13 lymph nodes). He follows up today about 10 weeks s/p his radical prostatectomy. He currently is using 1 pad per day which is minimally damp.     ALLERGIES: Codeine No Allergies    MEDICATIONS: Multi-Day TABS Oral  Vitamin D     GU PSH: Laparoscopy; Lymphadenectomy - 06/22/2016 Locm 300-399Mg /Ml Iodine,1Ml - 04/09/2016 Prostate Needle Biopsy - 03/16/2016 Robotic Radical Prostatectomy - 06/22/2016    NON-GU PSH: Inguinal Hernia Repair > 5 yrs, Right, inguinal Surgical Pathology, Gross And Microscopic Examination For Prostate Needle - 03/16/2016    GU PMH: Stress Incontinence - 08/13/2016, - 07/24/2016, - 06/12/2016, - 05/25/2016 ED due to arterial insufficiency - 06/30/2016 Prostate Cancer - 03/30/2016 BPH w/o LUTS - 02/11/2016 Prostate nodule w/o LUTS, Nodular prostate without lower urinary tract symptoms - 2014 Urinary Frequency, Increased urinary frequency - 2014      PMH Notes:   1) Prostate cancer: He is s/p a UNS RAL radical prostatectomy and BPLND on 06/22/16.   Diagnosis: pT3b N1 Mx, Gleason 4+5=9 adenocarcinoma with multiple positive margins (2/13 LN positive)  Pretreatment PSA: 4.03 (on 5 ARI)  Pretreatment  SHIM: 21   NON-GU PMH: Muscle weakness (generalized) - 08/13/2016, - 07/24/2016, - 06/12/2016, - 05/25/2016, - 05/18/2016 Other muscle spasm - 08/13/2016, - 07/24/2016, - 06/12/2016, - 05/25/2016 Asthma, Asthma - 2014 Glycosuria, Glycosuria - 2014 Gout    FAMILY HISTORY: 1 son - Son Family Health Status - Mother's Age - Runs In Family Father Deceased At [employer] ___ - Runs In Family father still living 36 - Father mother deceased at age 61 - Mother   SOCIAL HISTORY: Marital Status: Married Current Smoking Status: Patient has never smoked.  Does not use smokeless tobacco. Drinks 1 drink per week.  Does not use drugs. Drinks 1 caffeinated drink per day. Has not had a blood transfusion. Patient's occupation Hospital doctor.     Notes: Marital History - Currently Married, Occupation:, Caffeine Use, Never A Smoker, Alcohol Use   REVIEW OF SYSTEMS:    GU Review Male:   Patient denies frequent urination, hard to postpone urination, burning/ pain with urination, get up at night to urinate, leakage of urine, stream starts and stops, trouble starting your streams, and have to strain to urinate .  Gastrointestinal (Lower):   Patient denies diarrhea and constipation.  Gastrointestinal (Upper):   Patient denies nausea and vomiting.  Constitutional:   Patient denies fever, night sweats, weight loss, and fatigue.  Skin:   Patient denies skin rash/ lesion and itching.  Eyes:   Patient denies blurred vision and double vision.  Ears/ Nose/ Throat:   Patient denies sore throat and sinus problems.  Hematologic/Lymphatic:   Patient denies swollen glands and easy bruising.  Cardiovascular:   Patient denies leg swelling and chest pains.  Respiratory:   Patient denies cough and shortness of breath.  Endocrine:   Patient denies excessive thirst.  Musculoskeletal:   Patient denies back pain and joint pain.  Neurological:   Patient denies headaches and dizziness.  Psychologic:   Patient denies depression and  anxiety.   VITAL SIGNS: None   MULTI-SYSTEM PHYSICAL EXAMINATION:    Constitutional: Well-nourished. No physical deformities. Normally developed. Good grooming.     PAST DATA REVIEWED:  Source Of History:  Patient  Lab Test Review:   PSA  Records Review:   Pathology Reports, Previous Patient Records   08/13/16 07/16/11  PSA  Total PSA 0.19 ng/dl 2.33     PROCEDURES: None   ASSESSMENT:      ICD-10 Details  1 GU:   Prostate Cancer - C61   2   Stress Incontinence - N39.3   3   ED due to arterial insufficiency - N52.01    PLAN:           Document Letter(s):  Created for Patient: Clinical Summary         Notes:   1. Lymph node positive prostate cancer: We reviewed his PSA which is 0.19. He understands that he is at exceedingly high risk for harboring residual disease both locally and systemically. His case was reviewed in the multidisciplinary clinic today and it was agreed upon by Dr. Alen Blew, Dr. Tammi Klippel, and myself the proceeding with additional aggressive therapy with radiation therapy and a minimum of 6 months of androgen deprivation therapy would be beneficial. I have reviewed this with Paul Potter in detail including the potential side effects of androgen deprivation therapy and radiation. He is scheduled to see Dr. Tammi Klippel and Dr. Alen Blew later today. He tentatively will be scheduled to begin androgen deprivation in the near future. He will be scheduled left PSA in 3 months and follow-up with me for an office visit and another PSA in 6 months. All questions were answered to his stated satisfaction today.   2. Incontinence: This is almost resolved. He will start androgen deprivation therapy and still have some time to see continued improvement of his incontinence but should be able to begin radiation and any time in the near future.   3. Erectile dysfunction: We discussed how androgen deprivation therapy and radiation therapy may impact his libido and erectile function. We will not  proceed with treatment currently.   Cc: Dr. Hulan Fess  Dr. Baruch Gouty  Dr. Tyler Pita  Dr. Zola Button

## 2016-09-01 NOTE — Progress Notes (Signed)
Radiation Oncology         (336) [phone] ________________________________  Prostate Multidisciplinary Clinic Note  Initial outpatient Consultation  Name: Paul Potter MRN: [id number]  Date: 09/01/2016  DOB: Mar 16, 1961  SR:PRXYVO, Lennette Bihari, MD  Raynelle Bring, MD   REFERRING PHYSICIAN: Raynelle Bring, MD  DIAGNOSIS: 56 y.o. 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 Malignant neoplasm of prostate ([place]) 185 C61     HISTORY OF PRESENT ILLNESS: The patient has a h/o of BPH and was established with Dr. Pilar Jarvis, undergoing routine PSA screenings. His last PSA was performed 12/13/15 and found to be at 4.03 while on Finasteride (corrected PSA would be 8.06). His previous PSA in 2015 was 2.89 while on Finasteride (corrected PSA would be 5.78). His digital rectal examination 02/11/16 was abnormal, finding bilateral firmness with a 5 mm nodule at the right apex. The patient proceeded to transrectal ultrasound with 12 biopsies of the prostate on 03/16/16 . The prostate volume measured 29.98 cc. Out of 12 core biopsies, 7 were positive. The maximum Gleason score was 4+5, and this was seen in left base lateral and left mid. Gleason score 4+4 was seen in the left mid lateral, left apex lateral, left base, and left apex. Gleason score 3+3 was seen in the right apex.  CT pelvis and bone scan on 04/09/16 were negative for metastatic disease.  The patient met with Dr. Alinda Money on 05/12/16 to further discuss treatment options and ultimately decided to proceed with prostatectomy. He underwent a right nerve sparing robot-assisted laparaoscopic radical prostatectomy with BPLND on 06/22/16.  Unfortunately, pathology revealed EPE at the left posterior from the apex to the base, LVI, left seminal vesicle invasion with focally positive margins at the left apex and left base, as well as 2/7 right pelvic lymph  nodes postive for metastatic disease. pT3b, pN1.  Post-treatment PSA on 08/13/16 was 0.19.    The patient reviewed the pathology results with his urologist and he has kindly been referred today to the multidisciplinary prostate cancer clinic for presentation of pathology and radiology studies in our conference for discussion of potential salvage radiotherapy options and clinical evaluation.  PREVIOUS RADIATION THERAPY: No  PAST MEDICAL HISTORY:  Past Medical History:  Diagnosis Date  . Asthma   . History of hiatal hernia    inguinal hernia right ride, in youth  . Prostate cancer ([place])   . Sinus congestion       PAST SURGICAL HISTORY: Past Surgical History:  Procedure Laterality Date  . HERNIA REPAIR    . LAPAROSCOPY W/ LYMPHADENECTOMY / SAMPLING / BIOPSY LYMPH NODE  06/22/2016  . NASAL SEPTUM SURGERY    . PROSTATE BIOPSY  03/16/2016  . ROBOT ASSISTED LAPAROSCOPIC RADICAL PROSTATECTOMY N/A 06/22/2016   Procedure: ROBOTIC ASSISTED LAPAROSCOPIC RADICAL PROSTATECTOMY LEVEL 2/ BILATERAL PELVIC LYMPHADENECTOMY;  Surgeon: Raynelle Bring, MD;  Location: [facility];  Service: Urology;  Laterality: N/A;    FAMILY HISTORY:  Family History  Problem Relation Age of Onset  . Asthma Son   . Cancer Father        hairy cell leukemia  . Cancer Sister        breast    SOCIAL HISTORY:  Social History   Social History  . Marital status: Married    Spouse name: N/A  . Number of children: N/A  . Years of education: N/A  Occupational History  . Not on file.   Social History Main Topics  . Smoking status: Never Smoker  . Smokeless tobacco: Never Used  . Alcohol use Yes     Comment: occasional glass of wine.  . Drug use: No  . Sexual activity: Yes   Other Topics Concern  . Not on file   Social History Narrative  . No narrative on file    ALLERGIES: Hydrocodone; Chlorhexidine; and Codeine  MEDICATIONS:  Current Outpatient Prescriptions  Medication Sig Dispense Refill  .  diphenhydrAMINE (BENADRYL) 25 MG tablet Take 25 mg by mouth every 6 (six) hours as needed.    . docusate sodium (COLACE) 100 MG capsule Take 1 capsule (100 mg total) by mouth 2 (two) times daily. 10 capsule 0  . sulfamethoxazole-trimethoprim (BACTRIM DS,SEPTRA DS) 800-160 MG tablet Take 1 tablet by mouth 2 (two) times daily. Start the day prior to foley removal appointment 6 tablet 0  . traMADol (ULTRAM) 50 MG tablet Take 1 tablet (50 mg total) by mouth every 6 (six) hours as needed for moderate pain. 25 tablet 0   No current facility-administered medications for this encounter.     REVIEW OF SYSTEMS:  On review of systems, the patient reports that he is doing well overall. He denies any chest pain, shortness of breath, cough, fevers, chills, night sweats, unintended weight changes. He denies any bowel disturbances, and denies abdominal pain, nausea or vomiting. He reports fatigue, groin pain, muscle aches, wears glasses, sinus problems, sleeps on more than 1 pillow, dribbling urine, a history of skin cancer, joint pain, and gout. At this point, he is only requiring one male shield in his shorts daily for incontinence.  He has regained continence for the most part but continues to leak a small amount of urine with cough, sneeze or heavy lifting/straining.  He denies urinary leakage at night or urge incontinence.  His IPSS was 4, indicating mild urinary symptoms. He is unable to complete sexual activity. A complete review of systems is obtained and is otherwise negative.    PHYSICAL EXAM:  Wt Readings from Last 3 Encounters:  09/01/16 221 lb 12.8 oz (100.6 kg)  06/22/16 224 lb (101.6 kg)  06/12/16 220 lb 4.8 oz (99.9 kg)   Temp Readings from Last 3 Encounters:  06/23/16 98.2 F (36.8 C) (Oral)  06/12/16 98.2 F (36.8 C) (Oral)   BP Readings from Last 3 Encounters:  09/01/16 (!) 153/88  06/23/16 120/65  06/12/16 (!) 144/74   Pulse Readings from Last 3 Encounters:  09/01/16 97  06/23/16 83   06/12/16 91   Pain Assessment Pain Score: 0-No pain/10  In general this is a well appearing Caucasian male in no acute distress. He is alert and oriented x4 and appropriate throughout the examination. HEENT reveals that the patient is normocephalic, atraumatic. EOMs are intact. PERRLA. Skin is intact without any evidence of gross lesions. Cardiovascular exam reveals a regular rate and rhythm, no clicks rubs or murmurs are auscultated. Chest is clear to auscultation bilaterally. Lymphatic assessment is performed and does not reveal any adenopathy in the cervical, supraclavicular, or axillary chains. Abdomen has active bowel sounds in all quadrants and is intact. The abdomen is soft, non tender, non distended. Lower extremities are negative for pretibial pitting edema, deep calf tenderness, cyanosis or clubbing.   KPS = 100  100 - Normal; no complaints; no evidence of disease. 90   - Able to carry on normal activity; minor signs or symptoms of  disease. 80   - Normal activity with effort; some signs or symptoms of disease. 66   - Cares for self; unable to carry on normal activity or to do active work. 60   - Requires occasional assistance, but is able to care for most of his personal needs. 50   - Requires considerable assistance and frequent medical care. 24   - Disabled; requires special care and assistance. 67   - Severely disabled; hospital admission is indicated although death not imminent. 42   - Very sick; hospital admission necessary; active supportive treatment necessary. 10   - Moribund; fatal processes progressing rapidly. 0     - Dead  Karnofsky DA, Abelmann [place], Craver LS and Burchenal [facility] [phone]) The use of the nitrogen mustards in the palliative treatment of carcinoma: with particular reference to bronchogenic carcinoma Cancer 1 [id number]  LABORATORY DATA:  Lab Results  Component Value Date   WBC 9.5 06/12/2016   HGB 13.5 06/23/2016   HCT 39.8 06/23/2016   MCV 86.9 06/12/2016    PLT 248 06/12/2016   Lab Results  Component Value Date   NA 138 06/12/2016   K 3.8 06/12/2016   CL 103 06/12/2016   CO2 26 06/12/2016   No results found for: ALT, AST, GGT, ALKPHOS, BILITOT   RADIOGRAPHY: No results found.    IMPRESSION/PLAN: 1. 56 y.o. 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 Post-treatment PSA 08/13/16 was 0.19. Today Dr. Tammi Klippel reviewed the findings and workup thus far.  We discussed the natural history of high risk prostate cancer.  We reviewed the the implications of positive margins, extracapsular extension, and seminal vesicle involvement on the risk of prostate cancer recurrence. We reviewed some of the evidence suggesting an advantage for patients who undergo adjuvant radiotherapy in the setting in terms of disease control and overall survival. We also discussed some of the dilemmas related to the available evidence.  We discussed the SWOG trial which did show an improvement in disease-free survival as well as overall survival using adjuvant radiotherapy. However, we discussed the fact that the study did not carefully control the usage of adjuvant radiotherapy in the observation arm. There is increasing evidence that careful surveillance with ultrasensitive PSA may provide an opportunity for early salvage in patients who undergo observation, which can lead to excellent results in terms of disease control and survival. We discussed radiation treatment directed to the prostatic fossa with regard to the logistics and delivery of external beam radiation treatment. We also discussed the role of ADT in the treatment of prostate  cancer in this setting and reviewed anticipated side effects associated with this therapy.  The patient would like to proceed with androgen depravation with salvage radiotherapy. The patient continues to work with PT for pelvic floor strengthening and continence.  We will continue to allow for additional time to further improve his continence, therefore, CT simulation will be scheduled on 11/20/17 in anticipation of beginning radiotherapy on 11/30/16. Patient prefers an early morning appointment on 11/30/16 for treatment and alternate early morning appointments with late afternoon appointments to accommodate his local travel for his job in [employer].  We will share our findings  with Dr. Alinda Money and arrange follow up in his office to begin ADT in the near future.       We enjoyed meeting with him today, and will look forward to participating in the care of this very nice gentleman.   We spent 60 minutes face to face with the patient and more than 50% of that time was spent in counseling and/or coordination of care.   Nicholos Johns, PA-C  ------------------------------------------------  Sheral Apley Tammi Klippel, M.D.  This document serves as a record of services personally performed by Freeman Caldron, PA-C and Tyler Pita, MD. It was created on their behalf by Darcus Austin, a trained medical scribe. The creation of this record is based on the scribe's personal observations and the providers' statements to them. This document has been checked and approved by the attending provider.

## 2016-09-01 NOTE — Progress Notes (Signed)
Reason for Referral: Prostate cancer.   HPI: 56 year old gentleman currently of [place] where he resides. He was diagnosed with prostate cancer in December 2017 . A rise in his PSA to 4.03 and a biopsy at that time confirmed the presence of Gleason score 4+5 = 9 in 7 out of 12 cores. He underwent a radical prostatectomy in March 2018 and, pathology revealed a T3bN1 prostate cancer. He had a Gleason score 4+5 = 9 with multiple positive margins. He had 2 out of 13 lymph nodes involved with cancer. He recovered reasonably well from his operation and have regained continence without any major complications. He denied any dysuria or hematuria. He resumed work-related duties short after his operation. His staging workup prior to surgery did not show any evidence of metastatic disease.  He does not report any headaches, blurry vision, syncope or seizures. He does not report any fevers, chills, sweats or weight loss. He does not report any chest pain, palpitation, orthopnea or leg edema. He does not report any cough, wheezing or hemoptysis. He does not report any nausea or vomiting or abdominal pain. Does not report any constipation or diarrhea. He does not report any musca skeletal complaints. Remaining review of systems unremarkable.   Past Medical History:  Diagnosis Date  . Asthma   . History of hiatal hernia    inguinal hernia right ride, in youth  . Prostate cancer ([place])   . Sinus congestion   :  Past Surgical History:  Procedure Laterality Date  . HERNIA REPAIR    . LAPAROSCOPY W/ LYMPHADENECTOMY / SAMPLING / BIOPSY LYMPH NODE  06/22/2016  . NASAL SEPTUM SURGERY    . PROSTATE BIOPSY  03/16/2016  . ROBOT ASSISTED LAPAROSCOPIC RADICAL PROSTATECTOMY N/A 06/22/2016   Procedure: ROBOTIC ASSISTED LAPAROSCOPIC RADICAL PROSTATECTOMY LEVEL 2/ BILATERAL PELVIC LYMPHADENECTOMY;  Surgeon: Raynelle Bring, MD;  Location: [facility];  Service: Urology;  Laterality: N/A;  :   Current Outpatient  Prescriptions:  .  diphenhydrAMINE (BENADRYL) 25 MG tablet, Take 25 mg by mouth every 6 (six) hours as needed., Disp: , Rfl:  .  docusate sodium (COLACE) 100 MG capsule, Take 1 capsule (100 mg total) by mouth 2 (two) times daily., Disp: 10 capsule, Rfl: 0 .  sulfamethoxazole-trimethoprim (BACTRIM DS,SEPTRA DS) 800-160 MG tablet, Take 1 tablet by mouth 2 (two) times daily. Start the day prior to foley removal appointment, Disp: 6 tablet, Rfl: 0 .  traMADol (ULTRAM) 50 MG tablet, Take 1 tablet (50 mg total) by mouth every 6 (six) hours as needed for moderate pain., Disp: 25 tablet, Rfl: 0:  Allergies  Allergen Reactions  . Hydrocodone Itching  . Chlorhexidine Rash and Other (See Comments)    Burned skin  . Codeine Itching  :  Family History  Problem Relation Age of Onset  . Asthma Son   . Cancer Father        hairy cell leukemia  . Cancer Sister        breast  :  Social History   Social History  . Marital status: Married    Spouse name: N/A  . Number of children: N/A  . Years of education: N/A   Occupational History  . Not on file.   Social History Main Topics  . Smoking status: Never Smoker  . Smokeless tobacco: Never Used  . Alcohol use Yes     Comment: occasional glass of wine.  . Drug use: No  . Sexual activity: Yes   Other Topics Concern  .  Not on file   Social History Narrative  . No narrative on file  :  Pertinent items are noted in HPI.  Exam: ECOG 0 General appearance: alert and cooperative Throat: lips, mucosa, and tongue normal; teeth and gums normal Neck: no adenopathy Back: negative Resp: clear to auscultation bilaterally Chest wall: no tenderness Cardio: regular rate and rhythm, S1, S2 normal, no murmur, click, rub or gallop GI: soft, non-tender; bowel sounds normal; no masses,  no organomegaly Extremities: extremities normal, atraumatic, no cyanosis or edema  CBC    Component Value Date/Time   WBC 9.5 06/12/2016 1400   RBC 5.04  06/12/2016 1400   HGB 13.5 06/23/2016 0538   HCT 39.8 06/23/2016 0538   PLT 248 06/12/2016 1400   MCV 86.9 06/12/2016 1400   MCH 30.2 06/12/2016 1400   MCHC 34.7 06/12/2016 1400   RDW 12.6 06/12/2016 1400     Chemistry      Component Value Date/Time   NA 138 06/12/2016 1400   K 3.8 06/12/2016 1400   CL 103 06/12/2016 1400   CO2 26 06/12/2016 1400   BUN 16 06/12/2016 1400   CREATININE 0.96 06/12/2016 1400      Component Value Date/Time   CALCIUM 9.2 06/12/2016 1400       Assessment and Plan:   56 year old gentleman with prostate cancer diagnosed in December 2017. He was found to have a Gleason score 4+5 = 9 and subsequently underwent a radical prostatectomy in March 2018. The final pathology was T3bN1 with 2 out of 13 lymph nodes positive for malignancy. He has also multiple positive margins noted.  His case was discussed today and the prostate cancer multidisciplinary clinic. Imaging studies as well as his pathology was reviewed today and discussed with the reviewing radiologist and pathologist. Options of therapy were reviewed today which include adjuvant radiation therapy with androgen deprivation therapy for 6 months. The rationale for using adjuvant therapy in this setting given his high-risk disease, positive margins as well as a high Gleason score. Complications associated with androgen deprivation were reviewed today which include hot flashes, weight gain, erectile dysfunction among others. He is willing to proceed and likely start radiation therapy in the next 2 months after initiating androgen deprivation therapy.

## 2016-09-01 NOTE — Progress Notes (Signed)
                               Care Plan Summary  Name: Paul Potter DOB: 11/13/1960   Your Medical Team:   Urologist -  Dr. Raynelle Bring, [facility]  Radiation Oncologist - Dr. Tyler Pita, [facility]   Medical Oncologist - Dr. Zola Button, [place]  Recommendations: 1) Androgen Deprivation (hormone injection) x 6 months 2) Radiation    * These recommendations are based on information available as of today's consult.      Recommendations may change depending on the results of further tests or exams.  Next Steps: 1) Dr. Lynne Logan office to schedule hormone injection 2) CT simulation August 10 (planning session) 3) Radiation treatment start date- August 20   When appointments need to be scheduled, you will be contacted by [facility] and/or [facility].  Questions?  Please do not hesitate to call Cira Rue, RN, BSN, OCN at [phone] any questions or concerns.  Shirlean Mylar is your Oncology Nurse Navigator and is available to assist you while you're receiving your medical care at [facility].

## 2016-09-02 ENCOUNTER — Encounter: Payer: Self-pay | Admitting: General Practice

## 2016-09-02 ENCOUNTER — Encounter: Payer: Self-pay | Admitting: Radiation Oncology

## 2016-09-02 NOTE — Progress Notes (Signed)
[facility] Psychosocial Distress Screening Spiritual Care  Shadowed by counseling intern Wells Guiles Cash/LPCA, H[place], met with Paul Potter and wife Paul Potter in Prostate Multidisciplinary Clinic to introduce [facility] team/resources, reviewing distress screen per protocol.  The patient scored a 4.5 on the Psychosocial Distress Thermometer which indicates moderate distress. Also assessed for distress and other psychosocial needs.   ONCBCN DISTRESS SCREENING 05/06/2016  Screening Type Initial Screening  Distress experienced in past week (1-10) 2  Emotional problem type Nervousness/Anxiety;Adjusting to illness  Physical Problem type Skin dry/itchy  Physician notified of physical symptoms Yes  Referral to clinical psychology No  Referral to clinical social work No  Referral to dietition No  Referral to financial advocate No  Referral to support programs No  Referral to palliative care No   The Mortons enjoy humor and socializing for coping and meaning-making. They verbalized appreciation for the variety of [facility] team members and programming resources. Because [place] is their default city (from home in [place]), they hope to participate in some support programming.  Follow up needed: No.  Per couple, no other needs at this time, but they know to contact team with any questions or needs. Please also page if immediate needs arise. Thank you.   [place], [place], [facility] Pager [phone] Voicemail [phone]

## 2016-09-02 NOTE — Addendum Note (Signed)
Encounter addended by: Heywood Footman, RN on: 09/02/2016  4:57 PM<BR>    Actions taken: Outside historical medications filed, Order list changed, Order Reconciliation Section accessed, Home Medications modified, Visit Navigator Flowsheet section accepted

## 2016-09-10 ENCOUNTER — Telehealth: Payer: Self-pay | Admitting: Medical Oncology

## 2016-09-10 ENCOUNTER — Encounter: Payer: Self-pay | Admitting: Medical Oncology

## 2016-09-10 NOTE — Progress Notes (Signed)
I emailed Dr. Alinda Money to follow up on appointment for Lupron. He replied stating that his office is still waiting approval from the insurance company.

## 2016-09-10 NOTE — Progress Notes (Signed)
Mr. Carswell called stating he has not received a call from Dr. Lynne Logan office regarding appointment for Lupron. He has  PT appointment on June 4th and asked if he could get the same day. I will follow up with Dr. Alinda Money and call him back. He voiced understanding.

## 2016-09-11 ENCOUNTER — Telehealth: Payer: Self-pay | Admitting: Medical Oncology

## 2016-09-11 NOTE — Progress Notes (Signed)
Spoke with Paul Potter to inform Dr. Lynne Logan office is awaiting approval from his insurance company for [place]. There will be no problem getting him scheduled for June 4th if approved per Dr. Alinda Money. He voiced understanding.

## 2016-09-11 NOTE — Progress Notes (Signed)
Mr. Raburn called to inform me his Lupron is approved and he will receive the injection June 4th. He is scheduled for CT simulation in August. I asked him to call me with questions and or concerns. He voiced understanding.

## 2016-09-14 ENCOUNTER — Telehealth: Payer: Self-pay | Admitting: Medical Oncology

## 2016-09-14 NOTE — Progress Notes (Signed)
Mr. Divis received his Lupron injection today. He states he is currently scheduled for CT sim August 10 and start date August 20. He has a trip planned and needs to be finished by September 28. I will forwarded this information on to Dr. Tammi Klippel and Ailene Ards. He asked about his treatment schedule and I informed him the day of simulation he can discuss with the therapist.He voiced understanding.

## 2016-09-18 ENCOUNTER — Telehealth: Payer: Self-pay | Admitting: Urology

## 2016-10-26 ENCOUNTER — Encounter: Payer: Self-pay | Admitting: Medical Oncology

## 2016-10-27 NOTE — Progress Notes (Signed)
Paul Potter called stating he is doing well except he has more emotions than he has ever had. He is very tearful when songs with sad lyrics or watching sad movies. We discussed this could be related to side effects of Lupron. He does not feel depressed just tearful. He is having  hot flashes and some fatigue. He is scheduled for Ct simulation 11/06/16.

## 2016-11-01 NOTE — Progress Notes (Signed)
  Radiation Oncology         (336) [phone] ________________________________  Name: Paul Potter MRN: [id number]  Date: 11/06/2016  DOB: 05-Nov-1960  SIMULATION AND TREATMENT PLANNING NOTE    ICD-10-CM   1. Prostate cancer ([place]) C61     DIAGNOSIS:  56 y.o. gentleman with detectable post-prostatectomy PSA of 0.19 for Stage pT3b N1 Mx adenocarcinoma of the prostate with Gleason Score of 4+5  NARRATIVE:  The patient was brought to the [facility].  Identity was confirmed.  All relevant records and images related to the planned course of therapy were reviewed.  The patient freely provided informed written consent to proceed with treatment after reviewing the details related to the planned course of therapy. The consent form was witnessed and verified by the simulation staff.  Then, the patient was set-up in a stable reproducible supine position for radiation therapy.  A vacuum lock pillow device was custom fabricated to position his legs in a reproducible immobilized position.  Then, I performed a urethrogram under sterile conditions to identify the prostatic apex.  CT images were obtained.  Surface markings were placed.  The CT images were loaded into the planning software.  Then the prostate target and avoidance structures including the rectum, bladder, bowel and hips were contoured.  Treatment planning then occurred.  The radiation prescription was entered and confirmed.  A total of one complex treatment devices was fabricated. I have requested : Intensity Modulated Radiotherapy (IMRT) is medically necessary for this case for the following reason:  Rectal sparing.Marland Kitchen  PLAN:  The patient will receive 68.4 Gy in 38 fractions with 45 Gy to the pelvis and then boost the prostate fossa.  ________________________________  Sheral Apley Tammi Klippel, M.D.

## 2016-11-06 ENCOUNTER — Ambulatory Visit
Admission: RE | Admit: 2016-11-06 | Discharge: 2016-11-06 | Disposition: A | Discharge status: Home / Self Care | Payer: Commercial Managed Care - PPO | Source: Ambulatory Visit | Attending: Radiation Oncology | Admitting: Radiation Oncology

## 2016-11-06 ENCOUNTER — Encounter: Payer: Self-pay | Admitting: Medical Oncology

## 2016-11-06 DIAGNOSIS — C61 Malignant neoplasm of prostate: Secondary | ICD-10-CM

## 2016-11-06 DIAGNOSIS — Z510 Encounter for antineoplastic radiation therapy: Secondary | ICD-10-CM | POA: Diagnosis not present

## 2016-11-06 NOTE — Progress Notes (Signed)
Mr. Velazco son is with him today for CT simulation. He has lots of questions about the radiation and how his dad will be followed after radiation. I discussed these with him and he voiced a better understanding of treatment and plan. I asked him to call me with any questions or concerns.

## 2016-11-13 DIAGNOSIS — Z510 Encounter for antineoplastic radiation therapy: Secondary | ICD-10-CM | POA: Diagnosis not present

## 2016-11-16 ENCOUNTER — Ambulatory Visit: Payer: Commercial Managed Care - PPO

## 2016-11-16 ENCOUNTER — Encounter: Payer: Self-pay | Admitting: Medical Oncology

## 2016-11-16 ENCOUNTER — Ambulatory Visit
Admission: RE | Admit: 2016-11-16 | Discharge: 2016-11-16 | Disposition: A | Discharge status: Home / Self Care | Payer: Commercial Managed Care - PPO | Source: Ambulatory Visit | Attending: Radiation Oncology | Admitting: Radiation Oncology

## 2016-11-16 DIAGNOSIS — Z510 Encounter for antineoplastic radiation therapy: Secondary | ICD-10-CM | POA: Diagnosis not present

## 2016-11-17 ENCOUNTER — Ambulatory Visit
Admission: RE | Admit: 2016-11-17 | Discharge: 2016-11-17 | Disposition: A | Discharge status: Home / Self Care | Payer: Commercial Managed Care - PPO | Source: Ambulatory Visit | Attending: Radiation Oncology | Admitting: Radiation Oncology

## 2016-11-17 DIAGNOSIS — Z510 Encounter for antineoplastic radiation therapy: Secondary | ICD-10-CM | POA: Diagnosis not present

## 2016-11-18 ENCOUNTER — Ambulatory Visit
Admission: RE | Admit: 2016-11-18 | Discharge: 2016-11-18 | Disposition: A | Discharge status: Home / Self Care | Payer: Commercial Managed Care - PPO | Source: Ambulatory Visit | Attending: Radiation Oncology | Admitting: Radiation Oncology

## 2016-11-18 DIAGNOSIS — Z510 Encounter for antineoplastic radiation therapy: Secondary | ICD-10-CM | POA: Diagnosis not present

## 2016-11-19 ENCOUNTER — Ambulatory Visit
Admission: RE | Admit: 2016-11-19 | Discharge: 2016-11-19 | Disposition: A | Discharge status: Home / Self Care | Payer: Commercial Managed Care - PPO | Source: Ambulatory Visit | Attending: Radiation Oncology | Admitting: Radiation Oncology

## 2016-11-19 DIAGNOSIS — Z510 Encounter for antineoplastic radiation therapy: Secondary | ICD-10-CM | POA: Diagnosis not present

## 2016-11-20 ENCOUNTER — Ambulatory Visit
Admission: RE | Admit: 2016-11-20 | Discharge: 2016-11-20 | Disposition: A | Discharge status: Home / Self Care | Payer: Commercial Managed Care - PPO | Source: Ambulatory Visit | Attending: Radiation Oncology | Admitting: Radiation Oncology

## 2016-11-20 DIAGNOSIS — Z510 Encounter for antineoplastic radiation therapy: Secondary | ICD-10-CM | POA: Diagnosis not present

## 2016-11-23 ENCOUNTER — Ambulatory Visit
Admission: RE | Admit: 2016-11-23 | Discharge: 2016-11-23 | Disposition: A | Discharge status: Home / Self Care | Payer: Commercial Managed Care - PPO | Source: Ambulatory Visit | Attending: Radiation Oncology | Admitting: Radiation Oncology

## 2016-11-23 DIAGNOSIS — Z510 Encounter for antineoplastic radiation therapy: Secondary | ICD-10-CM | POA: Diagnosis not present

## 2016-11-24 ENCOUNTER — Ambulatory Visit
Admission: RE | Admit: 2016-11-24 | Discharge: 2016-11-24 | Disposition: A | Discharge status: Home / Self Care | Payer: Commercial Managed Care - PPO | Source: Ambulatory Visit | Attending: Radiation Oncology | Admitting: Radiation Oncology

## 2016-11-24 DIAGNOSIS — Z510 Encounter for antineoplastic radiation therapy: Secondary | ICD-10-CM | POA: Diagnosis not present

## 2016-11-25 ENCOUNTER — Ambulatory Visit
Admission: RE | Admit: 2016-11-25 | Discharge: 2016-11-25 | Disposition: A | Discharge status: Home / Self Care | Payer: Commercial Managed Care - PPO | Source: Ambulatory Visit | Attending: Radiation Oncology | Admitting: Radiation Oncology

## 2016-11-25 DIAGNOSIS — Z510 Encounter for antineoplastic radiation therapy: Secondary | ICD-10-CM | POA: Diagnosis not present

## 2016-11-26 ENCOUNTER — Ambulatory Visit
Admission: RE | Admit: 2016-11-26 | Discharge: 2016-11-26 | Disposition: A | Discharge status: Home / Self Care | Payer: Commercial Managed Care - PPO | Source: Ambulatory Visit | Attending: Radiation Oncology | Admitting: Radiation Oncology

## 2016-11-26 DIAGNOSIS — Z510 Encounter for antineoplastic radiation therapy: Secondary | ICD-10-CM | POA: Diagnosis not present

## 2016-11-27 ENCOUNTER — Other Ambulatory Visit: Payer: Self-pay | Admitting: Radiation Oncology

## 2016-11-27 ENCOUNTER — Ambulatory Visit
Admission: RE | Admit: 2016-11-27 | Discharge: 2016-11-27 | Disposition: A | Discharge status: Home / Self Care | Payer: Commercial Managed Care - PPO | Source: Ambulatory Visit | Attending: Radiation Oncology | Admitting: Radiation Oncology

## 2016-11-27 DIAGNOSIS — Z510 Encounter for antineoplastic radiation therapy: Secondary | ICD-10-CM | POA: Diagnosis not present

## 2016-11-27 MED ORDER — TAMSULOSIN HCL 0.4 MG PO CAPS: 0.4000 mg | ORAL_CAPSULE | Freq: Every day | ORAL | 1 refills | Status: DC

## 2016-11-28 ENCOUNTER — Ambulatory Visit: Payer: Commercial Managed Care - PPO

## 2016-11-29 ENCOUNTER — Ambulatory Visit: Payer: Commercial Managed Care - PPO

## 2016-11-30 ENCOUNTER — Ambulatory Visit
Admission: RE | Admit: 2016-11-30 | Discharge: 2016-11-30 | Disposition: A | Discharge status: Home / Self Care | Payer: Commercial Managed Care - PPO | Source: Ambulatory Visit | Attending: Radiation Oncology | Admitting: Radiation Oncology

## 2016-11-30 DIAGNOSIS — Z885 Allergy status to narcotic agent status: Secondary | ICD-10-CM | POA: Insufficient documentation

## 2016-11-30 DIAGNOSIS — Z9079 Acquired absence of other genital organ(s): Secondary | ICD-10-CM | POA: Diagnosis not present

## 2016-11-30 DIAGNOSIS — Z79899 Other long term (current) drug therapy: Secondary | ICD-10-CM | POA: Insufficient documentation

## 2016-11-30 DIAGNOSIS — R351 Nocturia: Secondary | ICD-10-CM | POA: Diagnosis not present

## 2016-11-30 DIAGNOSIS — Z510 Encounter for antineoplastic radiation therapy: Secondary | ICD-10-CM | POA: Insufficient documentation

## 2016-11-30 DIAGNOSIS — N393 Stress incontinence (female) (male): Secondary | ICD-10-CM | POA: Diagnosis not present

## 2016-11-30 DIAGNOSIS — C61 Malignant neoplasm of prostate: Secondary | ICD-10-CM | POA: Insufficient documentation

## 2016-12-01 ENCOUNTER — Other Ambulatory Visit: Payer: Self-pay | Admitting: Urology

## 2016-12-01 ENCOUNTER — Ambulatory Visit
Admission: RE | Admit: 2016-12-01 | Discharge: 2016-12-01 | Disposition: A | Discharge status: Home / Self Care | Payer: Commercial Managed Care - PPO | Source: Ambulatory Visit | Attending: Radiation Oncology | Admitting: Radiation Oncology

## 2016-12-01 ENCOUNTER — Telehealth: Payer: Self-pay | Admitting: Medical Oncology

## 2016-12-01 ENCOUNTER — Encounter: Payer: Self-pay | Admitting: Medical Oncology

## 2016-12-01 ENCOUNTER — Encounter: Payer: Self-pay | Admitting: Urology

## 2016-12-01 DIAGNOSIS — Z510 Encounter for antineoplastic radiation therapy: Secondary | ICD-10-CM | POA: Diagnosis not present

## 2016-12-01 MED ORDER — OXYBUTYNIN CHLORIDE 5 MG PO TABS: 5.0000 mg | ORAL_TABLET | Freq: Three times a day (TID) | ORAL | 0 refills | Status: DC | PRN

## 2016-12-01 NOTE — Telephone Encounter (Signed)
Paul Potter called stating that he received a prescription for Flomax on Friday for urinary frequency. He has not seen any improvement if anything his urinary symptoms are worse. He states he has limited his fluids after 6 pm and continues to get up hourly. Last night he slept for approximately 2 hours and he is miserable. He has radiation this afternoon and I will discuss with Ashlyn and return his call. He voiced understanding.

## 2016-12-01 NOTE — Progress Notes (Signed)
Spoke with Ashlyn-PA about patient's urinary symptoms. She will see patient when he arrives for radiation. I spoke with Mr. Salinger and he is aware.

## 2016-12-01 NOTE — Progress Notes (Signed)
I spoke with Paul Potter back on the machine following his treatment today. He continues with complaints of excessive nocturia up to 10+ times per night. He has daytime frequency as well but this is not nearly as bothersome. He denies any issues with incontinence, dysuria, gross hematuria or incomplete emptying. He reports that he makes a fair volume of urine with each void. He was provided with a prescription of Flomax to try last Friday and has been using this each evening since that time. He does not appreciated any relief. He is not getting any rest at night due to the nocturia and is feeling exhausted.  Given the fact that he is status post radical prostatectomy and feels that he is emptying his bladder adequately on voiding, I offered a prescription for oxybutynin to help with bladder spasms which are likely contributing to the frequency and nocturia. Prescription was sent to the [facility] in [place] at his request. We will plan to touch base again on Thursday to see if he is having improvement. If his symptoms worsen or he develops new bothersome symptoms he knows to call immediately.   Nicholos Johns, PA-C

## 2016-12-02 ENCOUNTER — Ambulatory Visit
Admission: RE | Admit: 2016-12-02 | Discharge: 2016-12-02 | Disposition: A | Discharge status: Home / Self Care | Payer: Commercial Managed Care - PPO | Source: Ambulatory Visit | Attending: Radiation Oncology | Admitting: Radiation Oncology

## 2016-12-02 DIAGNOSIS — Z510 Encounter for antineoplastic radiation therapy: Secondary | ICD-10-CM | POA: Diagnosis not present

## 2016-12-02 NOTE — Telephone Encounter (Signed)
Opened in Error- duplicate

## 2016-12-03 ENCOUNTER — Ambulatory Visit
Admission: RE | Admit: 2016-12-03 | Discharge: 2016-12-03 | Disposition: A | Discharge status: Home / Self Care | Payer: Commercial Managed Care - PPO | Source: Ambulatory Visit | Attending: Radiation Oncology | Admitting: Radiation Oncology

## 2016-12-03 ENCOUNTER — Other Ambulatory Visit: Payer: Self-pay | Admitting: Urology

## 2016-12-03 DIAGNOSIS — Z510 Encounter for antineoplastic radiation therapy: Secondary | ICD-10-CM | POA: Diagnosis not present

## 2016-12-03 MED ORDER — OXYBUTYNIN CHLORIDE ER 10 MG PO TB24: 10.0000 mg | ORAL_TABLET | Freq: Every day | ORAL | 1 refills | Status: DC

## 2016-12-04 ENCOUNTER — Ambulatory Visit
Admission: RE | Admit: 2016-12-04 | Discharge: 2016-12-04 | Disposition: A | Discharge status: Home / Self Care | Payer: Commercial Managed Care - PPO | Source: Ambulatory Visit | Attending: Radiation Oncology | Admitting: Radiation Oncology

## 2016-12-04 DIAGNOSIS — Z510 Encounter for antineoplastic radiation therapy: Secondary | ICD-10-CM | POA: Diagnosis not present

## 2016-12-05 ENCOUNTER — Ambulatory Visit: Payer: Commercial Managed Care - PPO

## 2016-12-07 ENCOUNTER — Ambulatory Visit
Admission: RE | Admit: 2016-12-07 | Discharge: 2016-12-07 | Disposition: A | Discharge status: Home / Self Care | Payer: Commercial Managed Care - PPO | Source: Ambulatory Visit | Attending: Radiation Oncology | Admitting: Radiation Oncology

## 2016-12-07 DIAGNOSIS — Z510 Encounter for antineoplastic radiation therapy: Secondary | ICD-10-CM | POA: Diagnosis not present

## 2016-12-08 ENCOUNTER — Ambulatory Visit
Admission: RE | Admit: 2016-12-08 | Discharge: 2016-12-08 | Disposition: A | Discharge status: Home / Self Care | Payer: Commercial Managed Care - PPO | Source: Ambulatory Visit | Attending: Radiation Oncology | Admitting: Radiation Oncology

## 2016-12-08 DIAGNOSIS — Z510 Encounter for antineoplastic radiation therapy: Secondary | ICD-10-CM | POA: Diagnosis not present

## 2016-12-09 ENCOUNTER — Ambulatory Visit
Admission: RE | Admit: 2016-12-09 | Discharge: 2016-12-09 | Disposition: A | Discharge status: Home / Self Care | Payer: Commercial Managed Care - PPO | Source: Ambulatory Visit | Attending: Radiation Oncology | Admitting: Radiation Oncology

## 2016-12-09 DIAGNOSIS — Z510 Encounter for antineoplastic radiation therapy: Secondary | ICD-10-CM | POA: Diagnosis not present

## 2016-12-10 ENCOUNTER — Encounter: Payer: Self-pay | Admitting: Urology

## 2016-12-10 ENCOUNTER — Telehealth: Payer: Self-pay | Admitting: Medical Oncology

## 2016-12-10 ENCOUNTER — Ambulatory Visit
Admission: RE | Admit: 2016-12-10 | Discharge: 2016-12-10 | Disposition: A | Discharge status: Home / Self Care | Payer: Commercial Managed Care - PPO | Source: Ambulatory Visit | Attending: Radiation Oncology | Admitting: Radiation Oncology

## 2016-12-10 DIAGNOSIS — Z510 Encounter for antineoplastic radiation therapy: Secondary | ICD-10-CM | POA: Diagnosis not present

## 2016-12-10 NOTE — Telephone Encounter (Signed)
I spoke with Ashlyn-PA and she informed me that she has spoken with patient and got him an appointment with Larry-PA at [facility] this afternoon at 2:45pm. I verified this appointment with Paul Potter and he was so appreciative of our help especially since a holiday weekend is coming.

## 2016-12-10 NOTE — Progress Notes (Signed)
I spoke with the patient today regarding his continued excessive daytime frequency, urgency, and nocturia 9-10 times per night despite using Ditropan XL.  He has noticed minimal if any change in his LUTS since starting this medication over a week ago. 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 I called over to Dr. Lynne Logan office at [facility] urology and was able to give him an appointment with Fritz Pickerel, NP for evaluation later today at 2:45 PM. I have called the patient and he is in agreement with this plan. He was quite grateful for the assistance with getting him an appointment with urology. We will plan to see him at 4:15 PM today for his regular scheduled treatment.  He has currently completed 18 out of 38 scheduled daily radiation treatments to the prostate fossa.   Nicholos Johns, PA-C

## 2016-12-10 NOTE — Telephone Encounter (Signed)
Paul Potter called stating that his urinary symptoms have not improved since starting Ditropan.  Ashlyn- PA with Dr. Tammi Klippel had discussed being evaluated by [facility] if his symptoms did not improve. He would like to know if we can make a referral to be seen as soon as possible. I will speak with Ashlyn and call him back.

## 2016-12-11 ENCOUNTER — Ambulatory Visit
Admission: RE | Admit: 2016-12-11 | Discharge: 2016-12-11 | Disposition: A | Discharge status: Home / Self Care | Payer: Commercial Managed Care - PPO | Source: Ambulatory Visit | Attending: Radiation Oncology | Admitting: Radiation Oncology

## 2016-12-11 DIAGNOSIS — Z510 Encounter for antineoplastic radiation therapy: Secondary | ICD-10-CM | POA: Diagnosis not present

## 2016-12-11 NOTE — Progress Notes (Signed)
Updated patients medications

## 2016-12-15 ENCOUNTER — Ambulatory Visit
Admission: RE | Admit: 2016-12-15 | Discharge: 2016-12-15 | Disposition: A | Discharge status: Home / Self Care | Payer: Commercial Managed Care - PPO | Source: Ambulatory Visit | Attending: Radiation Oncology | Admitting: Radiation Oncology

## 2016-12-15 DIAGNOSIS — Z510 Encounter for antineoplastic radiation therapy: Secondary | ICD-10-CM | POA: Diagnosis not present

## 2016-12-16 ENCOUNTER — Ambulatory Visit
Admission: RE | Admit: 2016-12-16 | Discharge: 2016-12-16 | Disposition: A | Discharge status: Home / Self Care | Payer: Commercial Managed Care - PPO | Source: Ambulatory Visit | Attending: Radiation Oncology | Admitting: Radiation Oncology

## 2016-12-16 DIAGNOSIS — Z510 Encounter for antineoplastic radiation therapy: Secondary | ICD-10-CM | POA: Diagnosis not present

## 2016-12-17 ENCOUNTER — Ambulatory Visit
Admission: RE | Admit: 2016-12-17 | Discharge: 2016-12-17 | Disposition: A | Discharge status: Home / Self Care | Payer: Commercial Managed Care - PPO | Source: Ambulatory Visit | Attending: Radiation Oncology | Admitting: Radiation Oncology

## 2016-12-17 DIAGNOSIS — Z510 Encounter for antineoplastic radiation therapy: Secondary | ICD-10-CM | POA: Diagnosis not present

## 2016-12-18 ENCOUNTER — Ambulatory Visit
Admission: RE | Admit: 2016-12-18 | Discharge: 2016-12-18 | Disposition: A | Discharge status: Home / Self Care | Payer: Commercial Managed Care - PPO | Source: Ambulatory Visit | Attending: Radiation Oncology | Admitting: Radiation Oncology

## 2016-12-18 DIAGNOSIS — Z510 Encounter for antineoplastic radiation therapy: Secondary | ICD-10-CM | POA: Diagnosis not present

## 2016-12-20 ENCOUNTER — Ambulatory Visit: Payer: Commercial Managed Care - PPO

## 2016-12-21 ENCOUNTER — Ambulatory Visit
Admission: RE | Admit: 2016-12-21 | Discharge: 2016-12-21 | Disposition: A | Discharge status: Home / Self Care | Payer: Commercial Managed Care - PPO | Source: Ambulatory Visit | Attending: Radiation Oncology | Admitting: Radiation Oncology

## 2016-12-21 DIAGNOSIS — Z510 Encounter for antineoplastic radiation therapy: Secondary | ICD-10-CM | POA: Diagnosis not present

## 2016-12-22 ENCOUNTER — Ambulatory Visit
Admission: RE | Admit: 2016-12-22 | Discharge: 2016-12-22 | Disposition: A | Discharge status: Home / Self Care | Payer: Commercial Managed Care - PPO | Source: Ambulatory Visit | Attending: Radiation Oncology | Admitting: Radiation Oncology

## 2016-12-22 DIAGNOSIS — Z510 Encounter for antineoplastic radiation therapy: Secondary | ICD-10-CM | POA: Diagnosis not present

## 2016-12-23 ENCOUNTER — Ambulatory Visit
Admission: RE | Admit: 2016-12-23 | Discharge: 2016-12-23 | Disposition: A | Discharge status: Home / Self Care | Payer: Commercial Managed Care - PPO | Source: Ambulatory Visit | Attending: Radiation Oncology | Admitting: Radiation Oncology

## 2016-12-23 DIAGNOSIS — Z510 Encounter for antineoplastic radiation therapy: Secondary | ICD-10-CM | POA: Diagnosis not present

## 2016-12-24 ENCOUNTER — Ambulatory Visit
Admission: RE | Admit: 2016-12-24 | Discharge: 2016-12-24 | Disposition: A | Discharge status: Home / Self Care | Payer: Commercial Managed Care - PPO | Source: Ambulatory Visit | Attending: Radiation Oncology | Admitting: Radiation Oncology

## 2016-12-24 DIAGNOSIS — Z510 Encounter for antineoplastic radiation therapy: Secondary | ICD-10-CM | POA: Diagnosis not present

## 2016-12-25 ENCOUNTER — Ambulatory Visit
Admission: RE | Admit: 2016-12-25 | Discharge: 2016-12-25 | Disposition: A | Discharge status: Home / Self Care | Payer: Commercial Managed Care - PPO | Source: Ambulatory Visit | Attending: Radiation Oncology | Admitting: Radiation Oncology

## 2016-12-25 DIAGNOSIS — Z510 Encounter for antineoplastic radiation therapy: Secondary | ICD-10-CM | POA: Diagnosis not present

## 2016-12-26 ENCOUNTER — Ambulatory Visit: Payer: Commercial Managed Care - PPO

## 2016-12-27 ENCOUNTER — Ambulatory Visit: Payer: Commercial Managed Care - PPO

## 2016-12-28 ENCOUNTER — Ambulatory Visit
Admission: RE | Admit: 2016-12-28 | Discharge: 2016-12-28 | Disposition: A | Discharge status: Home / Self Care | Payer: Commercial Managed Care - PPO | Source: Ambulatory Visit | Attending: Radiation Oncology | Admitting: Radiation Oncology

## 2016-12-28 DIAGNOSIS — Z510 Encounter for antineoplastic radiation therapy: Secondary | ICD-10-CM | POA: Diagnosis not present

## 2016-12-29 ENCOUNTER — Ambulatory Visit
Admission: RE | Admit: 2016-12-29 | Discharge: 2016-12-29 | Disposition: A | Discharge status: Home / Self Care | Payer: Commercial Managed Care - PPO | Source: Ambulatory Visit | Attending: Radiation Oncology | Admitting: Radiation Oncology

## 2016-12-29 DIAGNOSIS — Z510 Encounter for antineoplastic radiation therapy: Secondary | ICD-10-CM | POA: Diagnosis not present

## 2016-12-30 ENCOUNTER — Ambulatory Visit
Admission: RE | Admit: 2016-12-30 | Discharge: 2016-12-30 | Disposition: A | Discharge status: Home / Self Care | Payer: Commercial Managed Care - PPO | Source: Ambulatory Visit | Attending: Radiation Oncology | Admitting: Radiation Oncology

## 2016-12-30 DIAGNOSIS — Z510 Encounter for antineoplastic radiation therapy: Secondary | ICD-10-CM | POA: Diagnosis not present

## 2016-12-31 ENCOUNTER — Ambulatory Visit
Admission: RE | Admit: 2016-12-31 | Discharge: 2016-12-31 | Disposition: A | Discharge status: Home / Self Care | Payer: Commercial Managed Care - PPO | Source: Ambulatory Visit | Attending: Radiation Oncology | Admitting: Radiation Oncology

## 2016-12-31 DIAGNOSIS — Z510 Encounter for antineoplastic radiation therapy: Secondary | ICD-10-CM | POA: Diagnosis not present

## 2017-01-01 ENCOUNTER — Ambulatory Visit
Admission: RE | Admit: 2017-01-01 | Discharge: 2017-01-01 | Disposition: A | Discharge status: Home / Self Care | Payer: Commercial Managed Care - PPO | Source: Ambulatory Visit | Attending: Radiation Oncology | Admitting: Radiation Oncology

## 2017-01-01 DIAGNOSIS — Z510 Encounter for antineoplastic radiation therapy: Secondary | ICD-10-CM | POA: Diagnosis not present

## 2017-01-02 ENCOUNTER — Ambulatory Visit: Payer: Commercial Managed Care - PPO

## 2017-01-04 ENCOUNTER — Ambulatory Visit
Admission: RE | Admit: 2017-01-04 | Discharge: 2017-01-04 | Disposition: A | Discharge status: Home / Self Care | Payer: Commercial Managed Care - PPO | Source: Ambulatory Visit | Attending: Radiation Oncology | Admitting: Radiation Oncology

## 2017-01-04 DIAGNOSIS — Z510 Encounter for antineoplastic radiation therapy: Secondary | ICD-10-CM | POA: Diagnosis not present

## 2017-01-05 ENCOUNTER — Ambulatory Visit
Admission: RE | Admit: 2017-01-05 | Discharge: 2017-01-05 | Disposition: A | Discharge status: Home / Self Care | Payer: Commercial Managed Care - PPO | Source: Ambulatory Visit | Attending: Radiation Oncology | Admitting: Radiation Oncology

## 2017-01-05 DIAGNOSIS — Z510 Encounter for antineoplastic radiation therapy: Secondary | ICD-10-CM | POA: Diagnosis not present

## 2017-01-06 ENCOUNTER — Ambulatory Visit
Admission: RE | Admit: 2017-01-06 | Discharge: 2017-01-06 | Disposition: A | Discharge status: Home / Self Care | Payer: Commercial Managed Care - PPO | Source: Ambulatory Visit | Attending: Radiation Oncology | Admitting: Radiation Oncology

## 2017-01-06 DIAGNOSIS — Z510 Encounter for antineoplastic radiation therapy: Secondary | ICD-10-CM | POA: Diagnosis not present

## 2017-01-07 ENCOUNTER — Encounter: Payer: Self-pay | Admitting: Radiation Oncology

## 2017-01-07 ENCOUNTER — Ambulatory Visit
Admission: RE | Admit: 2017-01-07 | Discharge: 2017-01-07 | Disposition: A | Discharge status: Home / Self Care | Payer: Commercial Managed Care - PPO | Source: Ambulatory Visit | Attending: Radiation Oncology | Admitting: Radiation Oncology

## 2017-01-07 DIAGNOSIS — Z510 Encounter for antineoplastic radiation therapy: Secondary | ICD-10-CM | POA: Diagnosis not present

## 2017-01-07 NOTE — Progress Notes (Signed)
°  Radiation Oncology         [phone] ________________________________  Name: Paul Potter MRN: [id number]  Date: 01/07/2017  DOB: [birth year]  End of Treatment Note  Diagnosis:  56 y.o. 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 Patient experienced pronounced urinary urgency, daytime and night time frequency q 1hour, dysuria, and weak flow throughout treatment. His LUTS were only mildly improved with oxybutynin 5mg  po QID and Myrbetriq 50mg  po qd.  He denied hematuria during treatment.   Plan: The patient has completed radiation treatment. The patient will return to radiation oncology clinic for routine followup in one month. I advised him to call or return sooner if he has any questions or concerns related to his recovery or treatment. ________________________________  Sheral Apley. Tammi Klippel, M.D.   This document serves as a record of services personally performed by Tyler Pita, MD. It was created on his behalf by Bethann Humble, a trained medical scribe. The creation of this record is based on the scribe's personal observations and the provider's statements to them. This document has been checked and approved by the attending provider.

## 2017-01-08 ENCOUNTER — Ambulatory Visit: Payer: Commercial Managed Care - PPO

## 2017-01-26 ENCOUNTER — Encounter: Payer: Self-pay | Admitting: Radiation Oncology

## 2017-02-11 ENCOUNTER — Ambulatory Visit
Admission: RE | Admit: 2017-02-11 | Discharge: 2017-02-11 | Disposition: A | Discharge status: Home / Self Care | Payer: Commercial Managed Care - PPO | Source: Ambulatory Visit | Attending: Urology | Admitting: Urology

## 2017-02-11 ENCOUNTER — Encounter: Payer: Self-pay | Admitting: Urology

## 2017-02-11 VITALS — BP 156/78 | HR 96 | Temp 97.9°F | Resp 20 | Ht 67.5 in | Wt 221.8 lb

## 2017-02-11 DIAGNOSIS — C61 Malignant neoplasm of prostate: Secondary | ICD-10-CM

## 2017-02-11 DIAGNOSIS — Z510 Encounter for antineoplastic radiation therapy: Secondary | ICD-10-CM | POA: Diagnosis not present

## 2017-02-11 NOTE — Progress Notes (Signed)
Radiation Oncology         [phone] ________________________________  Name: Paul Potter MRN: [id number]  Date: 02/11/2017  DOB: Jan 02, 1961  Post Treatment Note  CC: Hulan Fess, MD  Nickie Retort, MD  Diagnosis:   56 y.o. 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  He tolerated radiation treatment relatively well. He experienced pronounced urinary urgency, daytime and night time frequency q 1hour, dysuria, and weak flow throughout treatment. His LUTS were only mildly improved with oxybutynin 5mg  po QID and Myrbetriq 50mg  po qd.  He denied hematuria during treatment.  He continued on Lupron for ADT through his course of treatment.                           On review of systems, the patient states that he is doing well overall.  He has noted gradual improvement in the daytime frequency, urgency and bladder emptying.  He continues with nocturia every 1.5-2hr/night.  The dysuria has resolved and he denies gross hematuria, fever, chills or night sweats.  He has continued taking Myrbetriq 50mg  po qd and Oxybutynin 5mg  po TID prn.  He continues with occasional SUI with cough/sneeze but otherwise, denies incontinence.  He has had gradual improvement in his bowel pattern, now with only occasional bouts of loose stools up to 3x/day but not every day. Overall, he is pleased with his progress to date.  ALLERGIES:  is allergic to hydrocodone; chlorhexidine; and codeine.  Meds: Current Outpatient Prescriptions  Medication Sig Dispense Refill  . Cholecalciferol (D3 ADULT PO) Take by mouth.    . Ginger, Zingiber officinalis,  (GINGER PO) Take by mouth.    . Glucos-Chond-Hyal Ac-Ca Fructo (MOVE FREE JOINT HEALTH ADVANCE PO) Take by mouth.    . mirabegron ER (MYRBETRIQ) 50 MG TB24 tablet Take 50 mg by mouth daily.    . multivitamin (ONE-A-DAY MEN'S) TABS tablet Take 1 tablet by mouth daily.    . NON FORMULARY GlucoCare 2 per day    . NON FORMULARY Black tart cherry po daily.    . NON FORMULARY Apple cider vinegar bid.    . Omega-3 Fatty Acids (FISH OIL) 1200 MG CAPS Take by mouth.    . oxybutynin (DITROPAN-XL) 5 MG 24 hr tablet Take 5 mg by mouth at bedtime.    . Probiotic Product (ACIDOPHILUS PEARLS PO) Take by mouth.    . vitamin B-12 (CYANOCOBALAMIN) 1000 MCG tablet Take 1,000 mcg by mouth daily.    . vitamin C (ASCORBIC ACID) 500 MG tablet Take 500 mg by mouth daily.     No current facility-administered medications for this encounter.     Physical Findings:  height is 5' 7.5" (1.715 m) and weight is 221 lb 12.8 oz (100.6 kg). His oral temperature is 97.9 F (36.6 C). His blood pressure is 156/78 (abnormal) and his pulse is 96. His respiration is 20 and oxygen saturation is 100%.  Pain Assessment Pain Score: 0-No pain/10 In general this is a well appearing caucasian male in no acute distress. He's alert and oriented  x4 and appropriate throughout the examination. Cardiopulmonary assessment is negative for acute distress and he exhibits normal effort.   Lab Findings: Lab Results  Component Value Date   WBC 9.5 06/12/2016   HGB 13.5 06/23/2016   HCT 39.8 06/23/2016   MCV 86.9 06/12/2016   PLT 248 06/12/2016     Radiographic Findings: No results found.  Impression/Plan: 1. 56 y.o. 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 He will continue to follow up with urology for ongoing PSA determinations and has an appointment scheduled with Dr. Alinda Money on 04/16/17. He understands what to expect with regards to PSA  monitoring going forward. I will look forward to following his response to treatment via correspondence with urology, and would be happy to continue to participate in his care if clinically indicated. I talked to the patient about what to expect in the future, including his risk for erectile dysfunction and rectal bleeding. I encouraged him to call or return to the office if he has any questions regarding his previous radiation or possible radiation side effects. He was comfortable with this plan and will follow up as needed.    Nicholos Johns, PA-C

## 2017-02-11 NOTE — Addendum Note (Signed)
Encounter addended by: Malena Edman, RN on: 02/11/2017 10:26 AM<BR>    Actions taken: Charge Capture section accepted

## 2018-05-05 ENCOUNTER — Telehealth: Payer: Self-pay | Admitting: Licensed Clinical Social Worker

## 2018-05-05 NOTE — Telephone Encounter (Signed)
Patient called wanting to schedule an appointment due to his sister's recent positive genetic testing. We scheduled him for Friday 1/31 at 10:30 am.

## 2018-05-13 ENCOUNTER — Encounter: Payer: Self-pay | Admitting: Licensed Clinical Social Worker

## 2018-05-13 ENCOUNTER — Inpatient Hospital Stay: Payer: Commercial Managed Care - PPO | Attending: Genetic Counselor | Admitting: Licensed Clinical Social Worker

## 2018-05-13 ENCOUNTER — Inpatient Hospital Stay: Payer: Commercial Managed Care - PPO

## 2018-05-13 DIAGNOSIS — Z803 Family history of malignant neoplasm of breast: Secondary | ICD-10-CM | POA: Diagnosis not present

## 2018-05-13 DIAGNOSIS — C61 Malignant neoplasm of prostate: Secondary | ICD-10-CM

## 2018-05-13 DIAGNOSIS — Z800 Family history of malignant neoplasm of digestive organs: Secondary | ICD-10-CM

## 2018-05-13 DIAGNOSIS — Z1379 Encounter for other screening for genetic and chromosomal anomalies: Secondary | ICD-10-CM | POA: Diagnosis not present

## 2018-05-13 DIAGNOSIS — Z806 Family history of leukemia: Secondary | ICD-10-CM

## 2018-05-13 DIAGNOSIS — Z8481 Family history of carrier of genetic disease: Secondary | ICD-10-CM | POA: Insufficient documentation

## 2018-05-13 NOTE — Progress Notes (Signed)
REFERRING PROVIDER: Self-referred  PRIMARY PROVIDER:  Hulan Fess, MD  PRIMARY REASON FOR VISIT:  1. Prostate cancer ([place])   2. Family history of breast cancer   3. Family history of pancreatic cancer   4. Family history of leukemia   5. Family history of breast cancer gene mutation in first degree relative      HISTORY OF PRESENT ILLNESS:   Paul Potter, a 58 y.o. male, was seen for a [place] cancer genetics consultation due to his sister's recent genetic testing which revealed an ATM pathogenic variant.  Paul Potter presents to clinic today to discuss the possibility of a hereditary predisposition to cancer, genetic testing, and to further clarify his future cancer risks, as well as potential cancer risks for family members.   In 2018, at the age of 10, Paul Potter was diagnosed with prostate cancer, Gleason score 4+5. This was treated with prostatectomy, radiation and hormone therapy. He also reports history of a melanoma on his chest that was removed.  Paul Potter reports history of one colonoscopy that was normal, was told to return in 10 years. He reports current PSA screening every 6 months due to his history. He does not smoke. He occasionally has a glass of wine.  Past Medical History:  Diagnosis Date  . Asthma   . Family history of breast cancer   . Family history of leukemia   . Family history of pancreatic cancer   . Family history of prostate cancer   . History of hiatal hernia    inguinal hernia right ride, in youth  . Prostate cancer ([place])   . Sinus congestion     Past Surgical History:  Procedure Laterality Date  . COLONOSCOPY  04/2014  . HERNIA REPAIR    . LAPAROSCOPY W/ LYMPHADENECTOMY / SAMPLING / BIOPSY LYMPH NODE  06/22/2016  . NASAL SEPTUM SURGERY    . PROSTATE BIOPSY  03/16/2016  . ROBOT ASSISTED LAPAROSCOPIC RADICAL PROSTATECTOMY N/A 06/22/2016   Procedure: ROBOTIC ASSISTED LAPAROSCOPIC RADICAL PROSTATECTOMY LEVEL 2/ BILATERAL PELVIC LYMPHADENECTOMY;   Surgeon: Raynelle Bring, MD;  Location: [facility];  Service: Urology;  Laterality: N/A;    Social History   Socioeconomic History  . Marital status: Married    Spouse name: Not on file  . Number of children: Not on file  . Years of education: Not on file  . Highest education level: Not on file  Occupational History  . Not on file  Social Needs  . Financial resource strain: Not on file  . Food insecurity:    Worry: Not on file    Inability: Not on file  . Transportation needs:    Medical: Not on file    Non-medical: Not on file  Tobacco Use  . Smoking status: Never Smoker  . Smokeless tobacco: Never Used  Substance and Sexual Activity  . Alcohol use: Yes    Comment: occasional glass of wine.  . Drug use: No  . Sexual activity: Yes  Lifestyle  . Physical activity:    Days per week: Not on file    Minutes per session: Not on file  . Stress: Not on file  Relationships  . Social connections:    Talks on phone: Not on file    Gets together: Not on file    Attends religious service: Not on file    Active member of club or organization: Not on file    Attends meetings of clubs or organizations: Not on file  Relationship status: Not on file  Other Topics Concern  . Not on file  Social History Narrative  . Not on file     FAMILY HISTORY:  We obtained a detailed, 4-generation family history.  Significant diagnoses are listed below: Family History  Problem Relation Age of Onset  . Asthma Son   . Cancer Father        hairy cell leukemia  . Cancer Sister        breast  . Pancreatic cancer Maternal Aunt 14  . Breast cancer Cousin        dx mid 87s    Paul Potter has one son who is 61. His sister [name], who is 45, has a history of breast cancer at 48 and recently had genetic testing that revealed an ATM pathogenic variant. He has another sister, [name], who is 60.  Paul Potter mother died at 77 of dementia. She had 3 brothers and 3 sisters. One of her sisters had  pancreatic cancer at 79 and died at 20. No cancers in his maternal cousins that he is aware of. He does not have information about his maternal grandparents.  Paul Potter father died at 39 and had a history of leukemia. He had a brother and 4 sisters. One of his brother's daughters, Paul Potter paternal cousin, had breast cancer diagnosed in her mid 44s. Paul Potter grandmother died in her early 41s, he does not have information about his paternal grandfather.  Paul Potter is aware of previous family history of genetic testing for hereditary cancer risks. Patient's maternal ancestors are of Caucasian descent, and paternal ancestors are of Caucasian descent. There is no reported Ashkenazi Jewish ancestry. There is no known consanguinity.  GENETIC COUNSELING ASSESSMENT: Paul Potter is a 58 y.o. male with a family history of an ATM pathogenic variant. We, therefore, discussed and recommended the following at today's visit.   DISCUSSION: We discussed that about 5-10% of cancer cases are hereditary. We discussed the ATM gene specifically, noting the cancers associated such as breast and pancreatic, and also discussed that prostate may be associated with this gene and therefore a positive genetic test could explain his history of prostate cancer.  We reviewed the characteristics, features and inheritance patterns of hereditary cancer syndromes. We also discussed genetic testing, including the appropriate family members to test, the process of testing, insurance coverage and turn-around-time for results. We discussed the implications of a negative, positive and/or variant of uncertain significant result. We recommended Paul Potter pursue genetic testing and offered to test ATM alone or to look at panel of genes. Paul Potter elected the [facility] Common Hereditary Cancers Panel.  The Common Hereditary Cancers Panel offered by [facility] includes sequencing and/or deletion duplication testing of the following 47  genes: APC, ATM, AXIN2, BARD1, BMPR1A, BRCA1, BRCA2, BRIP1, CDH1, CDKN2A (p14ARF), CDKN2A (p16INK4a), CKD4, CHEK2, CTNNA1, DICER1, EPCAM (Deletion/duplication testing only), GREM1 (promoter region deletion/duplication testing only), KIT, MEN1, MLH1, MSH2, MSH3, MSH6, MUTYH, NBN, NF1, NHTL1, PALB2, PDGFRA, PMS2, POLD1, POLE, PTEN, RAD50, RAD51C, RAD51D, SDHB, SDHC, SDHD, SMAD4, SMARCA4. STK11, TP53, TSC1, TSC2, and VHL.  The following genes were evaluated for sequence changes only: SDHA and HOXB13 c.251G>A variant only.  We discussed that if he is found to have a mutation in one of these genes, it may impact future medical management recommendations such as increased cancer screenings.  A positive result could also have implications for the patient's family members.  A Negative result would mean we did not identify  the familial ATM pathogenic variant in him, or any other pathogenic variants, but does not rule out the possibility of a hereditary basis for his personal history of cancer.  There could be mutations that are undetectable by current technology, or in genes not yet tested or identified to increase cancer risk.    We discussed the potential to find a Variant of Uncertain Significance or VUS.  These are variants that have not yet been identified as pathogenic or benign, and it is unknown if this variant is associated with increased cancer risk or if this is a normal finding.  Most VUS's are reclassified to benign or likely benign.   It should not be used to make medical management decisions. With time, we suspect the lab will determine the significance of any VUS's identified if any.   Based on Paul Potter's personal and family history of cancer and family history of a gene mutation, he meets NCCN medical criteria for genetic testing. Despite that he meets criteria, he may still have an out of pocket cost. The lab will notify him of an OOP if any.  PLAN: After considering the risks, benefits, and  limitations, Paul Potter  provided informed consent to pursue genetic testing and the blood sample was sent to [facility] for analysis of the Common Hereditary Cancers Panel. Results should be available within approximately 2-3 weeks' time, at which point they will be disclosed by telephone to Paul Potter, as will any additional recommendations warranted by these results. Paul Potter will receive a summary of his genetic counseling visit and a copy of his results once available. This information will also be available in Epic.  Based on his family history, we also recommended that the next steps include testing family members on both his maternal and paternal sides of the family to determine which side of the family the ATM pathogenic variant is coming from.   Lastly, we encouraged Mr. Neuner to remain in contact with cancer genetics annually so that we can continuously update the family history and inform him of any changes in cancer genetics and testing that may be of benefit for this family.   Mr.  Cervone questions were answered to his satisfaction today. Our contact information was provided should additional questions or concerns arise. Thank you for the referral and allowing [place] to share in the care of your patient.   Faith Rogue, MS Genetic Counselor [email or web address] Phone: [phone]  The patient was seen for a total of 30 minutes in face-to-face genetic counseling.

## 2018-05-31 ENCOUNTER — Encounter: Payer: Self-pay | Admitting: Licensed Clinical Social Worker

## 2018-05-31 ENCOUNTER — Ambulatory Visit: Payer: Self-pay | Admitting: Licensed Clinical Social Worker

## 2018-05-31 ENCOUNTER — Telehealth: Payer: Self-pay | Admitting: Licensed Clinical Social Worker

## 2018-05-31 DIAGNOSIS — Z1509 Genetic susceptibility to other malignant neoplasm: Principal | ICD-10-CM

## 2018-05-31 DIAGNOSIS — Z1379 Encounter for other screening for genetic and chromosomal anomalies: Secondary | ICD-10-CM

## 2018-05-31 DIAGNOSIS — Z1501 Genetic susceptibility to malignant neoplasm of breast: Secondary | ICD-10-CM | POA: Insufficient documentation

## 2018-05-31 DIAGNOSIS — Z1589 Genetic susceptibility to other disease: Principal | ICD-10-CM

## 2018-05-31 NOTE — Telephone Encounter (Signed)
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 Reviewed what we discussed in our initial session about ATM. He will speak with his son and give him my number so that he can come in for testing as well.

## 2018-05-31 NOTE — Progress Notes (Signed)
GENETIC TEST RESULTS   Patient Name: Paul Potter Patient Age: 58 y.o. Encounter Date: 05/31/2018   Paul Potter was seen in the [facility] on 05/13/2018 due to his sister's recent genetic test results which revealed an ATM pathogenic variant. Please refer to the prior Genetics clinic note for more information regarding Paul Potter's medical and family histories and our assessment at the time.   In 2018, at the age of 62, Paul Potter was diagnosed with prostate cancer, Gleason score 4+5. This was treated with prostatectomy, radiation and hormone therapy. He also reports history of a melanoma on his chest that was removed.  FAMILY HISTORY:  We obtained a detailed, 4-generation family history.  Significant diagnoses are listed below: Family History  Problem Relation Age of Onset  . Asthma Son   . Cancer Father        hairy cell leukemia  . Cancer Sister        breast  . Pancreatic cancer Maternal Aunt 55  . Breast cancer Cousin        dx mid 24s    Paul Potter has one son who is 48. His sister Paul Potter, who is 61, has a history of breast cancer at 108 and recently had genetic testing that revealed an ATM pathogenic variant. He has another sister, Paul Potter, who is 21.  Paul Potter mother died at 26 of dementia. She had 3 brothers and 3 sisters. One of her sisters had pancreatic cancer at 80 and died at 74. No cancers in his maternal cousins that he is aware of. He does not have information about his maternal grandparents.  Paul Potter father died at 57 and had a history of leukemia. He had a brother and 4 sisters. One of his brother's daughters, Paul Potter paternal cousin, had breast cancer diagnosed in her mid 78s. Paul Potter grandmother died in her early 93s, he does not have information about his paternal grandfather.  Paul Potter is aware of previous family history of genetic testing for hereditary cancer risks. Patient's maternal ancestors are of Caucasian descent, and paternal  ancestors are of Caucasian descent. There is no reported Ashkenazi Jewish ancestry. There is no known consanguinity.   GENETIC TESTING:  At the time of Paul Potter visit, we recommended he pursue genetic testing for the specific ATM mutation (pathogenic variant)  that was identified in the family. 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  At this time, it is unknown if this variant is associated with increased cancer risk or if this is a normal finding, but most variants such as this get reclassified to being inconsequential. It should not be used to make medical management decisions. With time, we suspect the lab will determine the significance of this variant, if any. If we do learn more about it, we will try to contact Mr. Geurts to discuss it further. However, it is important to stay in touch with [place] periodically and keep the address and phone number up to date.    DISCUSSION:  ATM Inheritance  Hereditary predisposition to cancer due to pathogenic variants in the ATM gene has autosomal dominant inheritance. This means that an individual with a pathogenic variant has a 50% chance of passing the condition on to their offspring. Once a pathogenic mutation is detected in an individual, it is possible to identify at-risk relatives  who can pursue testing for this specific familial variant. Many cases are inherited from a parent, but some cases can occur spontaneously (i.e., an individual with a pathogenic variant who has parents who do not have it).  Individuals with a single pathogenic variant in ATM are also carriers of ataxia-telangiectasia (A-T). A-T is an autosomal recessive condition that results when an individual inherits a pathogenic ATM variant from each parent. Features include progressive cerebellar ataxia,  oculomotor apraxia, choreoathetosis, telangiectasias of the conjunctivae, immunodeficiency, premature aging, endocrine abnormalities, and increased risk for malignancy-particularly leukemia and lymphoma (PMID: 62130865, [id number], [id number]). For there to be a risk of A-T in offspring, both the patient and his/her partner would each have to carry a pathogenic variant in ATM. In this case, the risk of having have an affected child would be 25%.  ATM Cancer Risks  Men: Increased risk for prostate cancer Men and Women: Increased risk for pancreatic cancer Women: 17-60% lifetime risk for breast cancer, increased risk for ovarian   There may also be risks for other cancer types, however the current evidence is limited and emerging.  ATM Management  Prostate and Pancreatic Risks   Unknown or insufficient evidence for prostate cancer risk  For pancreatic cancer risk, screening can be considered at age 106 if there is a diagnosis of pancreatic cancer in one of [name] first or second degree relatives. He does have a maternal aunt (second degree relative) who passed away from pancreatic cancer. For individuals considering pancreatic cancer screening, it is recommended that screening consist of annual contrast-enhanced MRI/MCRP and/or EUS.  For women there is an increased risk for breast cancer, and possibly ovarian cancer:  Breast Cancer     Screening: Annual mammogram with consideration of tomosynthesis and consider breast MRI with contrast starting at age 13 years.  Risk Reducing Mastectomy: Evidence insufficient, consider based on family history  Ovarian Cancer   For potential increased risk for ovarian cancer, manage based on family history. Evidence insufficient to recommend RRSO.  There has been some debate as to whether ionizing radiation is considered significantly carcinogenic to individuals with a single pathogenic ATM variant. There is currently no substantial evidence to  suggest there are additional side effects from ionizing radiation compared to those who do not have an ATM pathogenic variant  These guidelines are based on current NCCN guidelines (v.1.2020). These guidelines are subject to change and continually updated and should be directly referenced for future medical management.   An individual's cancer risk and medical management are not determined by genetic test results alone. Overall cancer risk assessment incorporates additional factors, including personal medical history, family history, and any available genetic information that may result in a personalized plan for cancer prevention and surveillance.  FAMILY MEMBERS: It is important that all of Mr. Crescenzo relatives (both men and women) know of the presence of this gene mutation. Site-specific genetic testing can sort out who in the family is at risk and who is not.   Mr. Morgan son has a 50% chance to have inherited this mutation. We recommend they have genetic testing for this same mutation, as identifying the presence of this mutation would allow them to also take advantage of risk-reducing measures.   PLAN:  1. Mr. Viney requested his results be sent to Dr. Alinda Money, his urologist, and Dr. Rex Kras, his PCP. He would like Dr. Rex Kras to follow him for this indication.   2. Mr. Nickson plans to tell his relatives about this mutation. He gives  permission to discuss these genetic test results with his son.   SUPPORT AND RESOURCES: If Mr. Capo is interested in ATM-specific information and support, there are two groups, Facing Our Risk (www.facingourrisk.com) and Bright Pink (www.brightpink.org) which some people have found useful. They provide opportunities to speak with other individuals from high-risk families. To locate genetic counselors in other cities, visit the website of the [employer] (ArtistMovie.se) and Secretary/administrator for a Social worker by zip code.  We encouraged Mr.  Patchell to remain in contact with [place] on an annual basis so we can update his personal and family histories, and let him know of advances in cancer genetics that may benefit the family. Our contact number was provided. Mr. Rathje questions were answered to his satisfaction today, and he knows he is welcome to call anytime with additional questions.   Faith Rogue, MS Genetic Counselor [email or web address] Phone: [phone]

## 2020-12-23 ENCOUNTER — Other Ambulatory Visit: Payer: Self-pay

## 2020-12-23 ENCOUNTER — Ambulatory Visit
Admission: EM | Admit: 2020-12-23 | Discharge: 2020-12-23 | Disposition: A | Discharge status: Home / Self Care | Payer: Commercial Managed Care - PPO | Attending: Urgent Care | Admitting: Urgent Care

## 2020-12-23 ENCOUNTER — Encounter: Payer: Self-pay | Admitting: Emergency Medicine

## 2020-12-23 DIAGNOSIS — M62830 Muscle spasm of back: Secondary | ICD-10-CM | POA: Diagnosis not present

## 2020-12-23 DIAGNOSIS — Z8546 Personal history of malignant neoplasm of prostate: Secondary | ICD-10-CM

## 2020-12-23 DIAGNOSIS — M546 Pain in thoracic spine: Secondary | ICD-10-CM

## 2020-12-23 MED ORDER — NAPROXEN 375 MG PO TABS: 375.0000 mg | ORAL_TABLET | Freq: Two times a day (BID) | ORAL | 0 refills | Status: AC

## 2020-12-23 MED ORDER — TIZANIDINE HCL 4 MG PO TABS: 4.0000 mg | ORAL_TABLET | Freq: Every day | ORAL | 0 refills | Status: AC

## 2020-12-23 NOTE — ED Provider Notes (Signed)
[name]   MRN: [id number] DOB: Sep 11, 1960  Subjective:   Paul Potter is a 60 y.o. male presenting for 4-day history of bilateral thoracic back pain.  Denies fever, cough, chest pain, shortness of breath, falls, trauma, hematuria, nausea, vomiting, abdominal pain, dysuria.  Patient has a history of renal stones and reports that this feels very different.  Cannot recall anything specifically that he did recently to cause his back pain.  Simply woke up with his symptoms.  Had a similar episode several years ago and reports that it resolved on its own with muscle relaxants and pain medication.  He does have a history of prostate cancer and has been in remission for 5 years.  No rashes.  No current facility-administered medications for this encounter.  Current Outpatient Medications:    Cholecalciferol (D3 ADULT PO), Take by mouth., Disp: , Rfl:    Ginger, Zingiber officinalis, (GINGER PO), Take by mouth., Disp: , Rfl:    Glucos-Chond-Hyal Ac-Ca Fructo (MOVE FREE JOINT HEALTH ADVANCE PO), Take by mouth., Disp: , Rfl:    mirabegron ER (MYRBETRIQ) 50 MG TB24 tablet, Take 50 mg by mouth daily., Disp: , Rfl:    multivitamin (ONE-A-DAY MEN'S) TABS tablet, Take 1 tablet by mouth daily., Disp: , Rfl:    NON FORMULARY, GlucoCare 2 per day, Disp: , Rfl:    NON FORMULARY, Black tart cherry po daily., Disp: , Rfl:    NON FORMULARY, Apple cider vinegar bid., Disp: , Rfl:    Omega-3 Fatty Acids (FISH OIL) 1200 MG CAPS, Take by mouth., Disp: , Rfl:    oxybutynin (DITROPAN-XL) 5 MG 24 hr tablet, Take 5 mg by mouth at bedtime., Disp: , Rfl:    Probiotic Product (ACIDOPHILUS PEARLS PO), Take by mouth., Disp: , Rfl:    vitamin B-12 (CYANOCOBALAMIN) 1000 MCG tablet, Take 1,000 mcg by mouth daily., Disp: , Rfl:    vitamin C (ASCORBIC ACID) 500 MG tablet, Take 500 mg by mouth daily., Disp: , Rfl:    Allergies  Allergen Reactions   Hydrocodone Itching   Chlorhexidine Rash and Other (See  Comments)    Burned skin   Codeine Itching    Past Medical History:  Diagnosis Date   Asthma    Family history of breast cancer    Family history of leukemia    Family history of pancreatic cancer    Family history of prostate cancer    History of hiatal hernia    inguinal hernia right ride, in youth   Prostate cancer [facility])    Sinus congestion      Past Surgical History:  Procedure Laterality Date   COLONOSCOPY  04/2014   HERNIA REPAIR     LAPAROSCOPY W/ LYMPHADENECTOMY / SAMPLING / BIOPSY LYMPH NODE  06/22/2016   NASAL SEPTUM SURGERY     PROSTATE BIOPSY  03/16/2016   ROBOT ASSISTED LAPAROSCOPIC RADICAL PROSTATECTOMY N/A 06/22/2016   Procedure: ROBOTIC ASSISTED LAPAROSCOPIC RADICAL PROSTATECTOMY LEVEL 2/ BILATERAL PELVIC LYMPHADENECTOMY;  Surgeon: Raynelle Bring, MD;  Location: [facility];  Service: Urology;  Laterality: N/A;    Family History  Problem Relation Age of Onset   Asthma Son    Cancer Father        hairy cell leukemia   Cancer Sister        breast   Pancreatic cancer Maternal Aunt 96   Breast cancer Cousin        dx mid 90s    Social History   Tobacco Use  Smoking status: Never   Smokeless tobacco: Never  Vaping Use   Vaping Use: Never used  Substance Use Topics   Alcohol use: Yes    Comment: occasional glass of wine.   Drug use: No    ROS   Objective:   Vitals: BP 140/88 (BP Location: Left Arm)   Pulse 92   Temp 98.7 F (37.1 C) (Oral)   Ht '5\' 9"'$  (1.753 m)   Wt 209 lb (94.8 kg)   SpO2 97%   BMI 30.86 kg/m   Physical Exam Constitutional:      General: He is not in acute distress.    Appearance: Normal appearance. He is well-developed and normal weight. He is not ill-appearing, toxic-appearing or diaphoretic.  HENT:     Head: Normocephalic and atraumatic.     Right Ear: External ear normal.     Left Ear: External ear normal.     Nose: Nose normal.     Mouth/Throat:     Mouth: Mucous membranes are moist.     Pharynx: Oropharynx is  clear.  Eyes:     General: No scleral icterus.       Right eye: No discharge.        Left eye: No discharge.     Extraocular Movements: Extraocular movements intact.     Pupils: Pupils are equal, round, and reactive to light.  Cardiovascular:     Rate and Rhythm: Normal rate and regular rhythm.     Heart sounds: Normal heart sounds. No murmur heard.   No friction rub. No gallop.  Pulmonary:     Effort: Pulmonary effort is normal. No respiratory distress.     Breath sounds: Normal breath sounds. No stridor. No wheezing, rhonchi or rales.  Musculoskeletal:     Cervical back: Normal range of motion.     Thoracic back: Spasms (With associated tenderness, L>R) and tenderness (over either side of thoracic back, no midline tenderness) present. No swelling, edema, deformity, signs of trauma, lacerations or bony tenderness. Normal range of motion. No scoliosis.     Comments: Ambulates without any assistance at an expected pace.  Strength 5/5 for lower extremities.  Skin:    General: Skin is warm and dry.  Neurological:     Mental Status: He is alert and oriented to person, place, and time.     Motor: No weakness.     Coordination: Coordination normal.     Gait: Gait normal.     Deep Tendon Reflexes: Reflexes normal.  Psychiatric:        Mood and Affect: Mood normal.        Behavior: Behavior normal.        Thought Content: Thought content normal.        Judgment: Judgment normal.     Assessment and Plan :   PDMP not reviewed this encounter.  1. Back spasm   2. Acute bilateral thoracic back pain   3. History of prostate cancer     Patient has reassuring physical exam findings.  Recommended conservative management and deferred imaging given lack of physical exam findings warranting this.  Naproxen for pain and inflammation, tizanidine as a muscle relaxant. Counseled patient on potential for adverse effects with medications prescribed/recommended today, ER and return-to-clinic  precautions discussed, patient verbalized understanding.    Jaynee Eagles, [place] 12/23/20 1107

## 2020-12-23 NOTE — ED Triage Notes (Signed)
Patient c/o low back pain x 4 days.  No apparent injury.  Has applied heating pad, Ibuprofen and back massage, no relief.  Maybe muscle spasms.

## 2021-02-14 ENCOUNTER — Other Ambulatory Visit: Payer: Self-pay | Admitting: Orthopedic Surgery

## 2021-02-14 DIAGNOSIS — M25562 Pain in left knee: Secondary | ICD-10-CM

## 2021-02-15 ENCOUNTER — Ambulatory Visit (INDEPENDENT_AMBULATORY_CARE_PROVIDER_SITE_OTHER): Payer: Commercial Managed Care - PPO

## 2021-02-15 ENCOUNTER — Other Ambulatory Visit: Payer: Self-pay

## 2021-02-15 DIAGNOSIS — M25562 Pain in left knee: Secondary | ICD-10-CM

## 2022-04-27 ENCOUNTER — Other Ambulatory Visit: Payer: Self-pay | Admitting: Neuro-Ophthalmology

## 2022-04-27 DIAGNOSIS — H47011 Ischemic optic neuropathy, right eye: Secondary | ICD-10-CM

## 2022-04-28 ENCOUNTER — Ambulatory Visit (INDEPENDENT_AMBULATORY_CARE_PROVIDER_SITE_OTHER): Payer: Commercial Managed Care - PPO

## 2022-04-28 DIAGNOSIS — H47011 Ischemic optic neuropathy, right eye: Secondary | ICD-10-CM | POA: Diagnosis not present

## 2022-04-28 MED ORDER — GADOBUTROL 1 MMOL/ML IV SOLN
10.0000 mL | Freq: Once | INTRAVENOUS | Status: AC | PRN
  Administered 2022-04-28: 10 mL via INTRAVENOUS

## 2023-03-24 ENCOUNTER — Other Ambulatory Visit: Payer: Self-pay | Admitting: Orthopedic Surgery

## 2023-03-24 DIAGNOSIS — M546 Pain in thoracic spine: Secondary | ICD-10-CM

## 2023-04-05 ENCOUNTER — Ambulatory Visit: Payer: Commercial Managed Care - PPO

## 2023-04-05 DIAGNOSIS — M62830 Muscle spasm of back: Secondary | ICD-10-CM

## 2023-04-05 DIAGNOSIS — M546 Pain in thoracic spine: Secondary | ICD-10-CM

## 2023-09-23 IMAGING — MR MR KNEE*L* W/O CM
6 of 7 series · 33 of 40 positions shown · non-contrast
Comparison: NM whole body - 04/09/2016.

CLINICAL DATA: Patient complains of left lateral/posterior knee
pain for 2 weeks. No known injury. History of prostate cancer in
6263. No history of surgery reported. Patient reports previous
injection x 1 week.

EXAM:
MRI OF THE LEFT KNEE WITHOUT CONTRAST
TECHNIQUE: Multiplanar, multisequence MR imaging of the knee was performed. No
intravenous contrast was administered.

[Series 3: T2 fat-sat · axial · 4.0mm · 0.50mm/px · z∈[-94,+61]mm · 6 of 32 slices shown (1 of 3)]
[im 1/32]
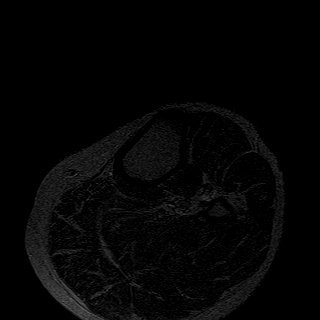
[im 7/32]
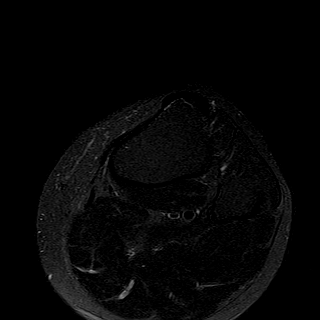
[im 13/32]
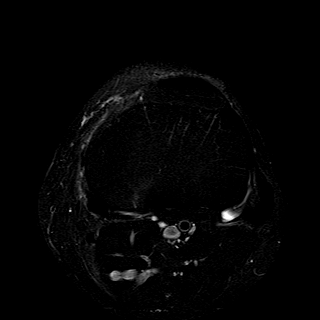
[im 19/32]
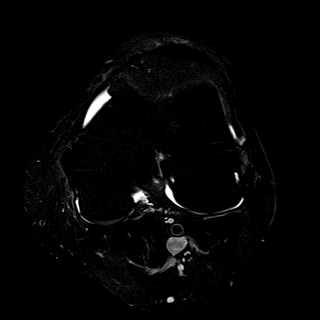
[im 25/32]
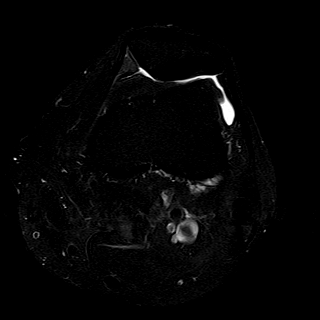
[im 32/32]
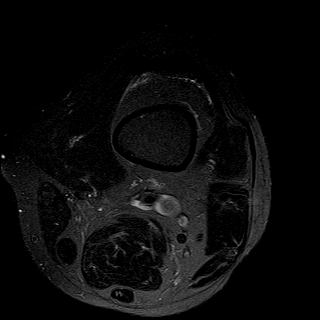

[Series 5: T2 fat-sat · coronal · 4.0mm · 0.59mm/px · 6 of 28 slices shown (2 of 3)]
[im 1/28]
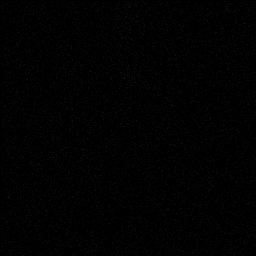
[im 6/28]
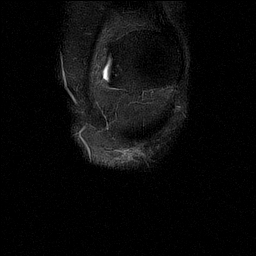
[im 11/28]
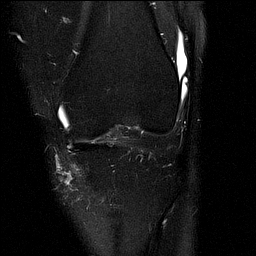
[im 17/28]
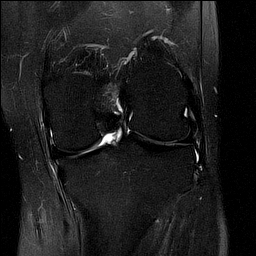
[im 22/28]
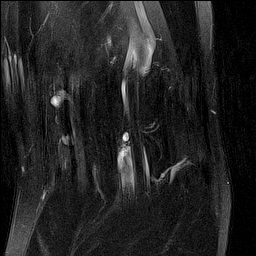
[im 28/28]
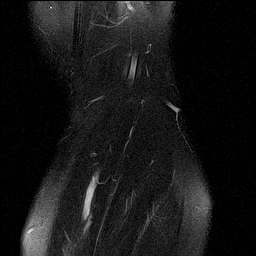

[Series 6: PD fat-sat · coronal · 4.0mm · 0.59mm/px · 6 of 28 slices shown (1 of 3)]
[im 1/28]
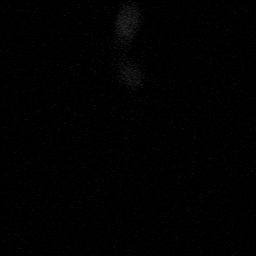
[im 6/28]
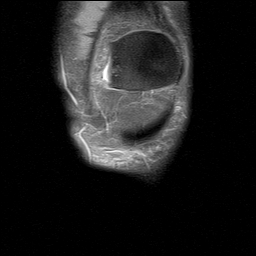
[im 11/28]
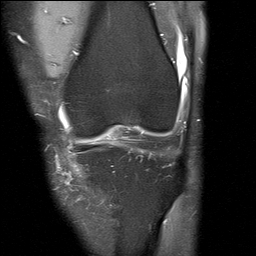
[im 17/28]
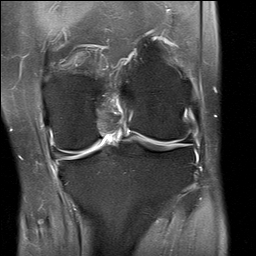
[im 22/28]
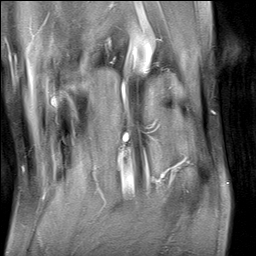
[im 28/28]
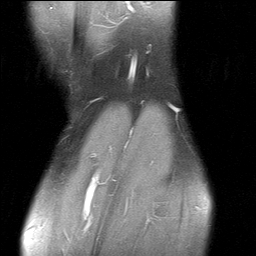

[Series 7: PD fat-sat · sagittal · 3.0mm · 0.29mm/px · 6 of 32 slices shown (2 of 3)]
[im 1/32]
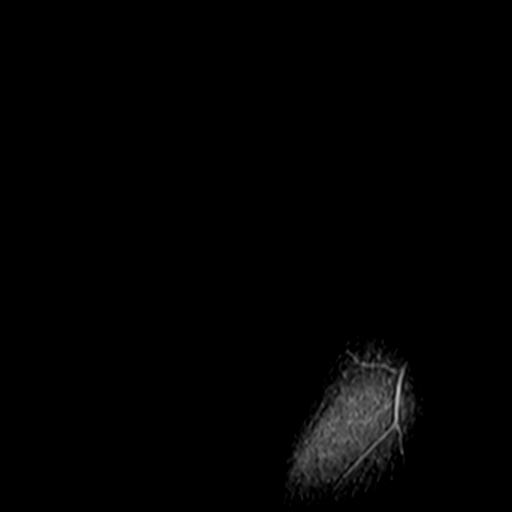
[im 7/32]
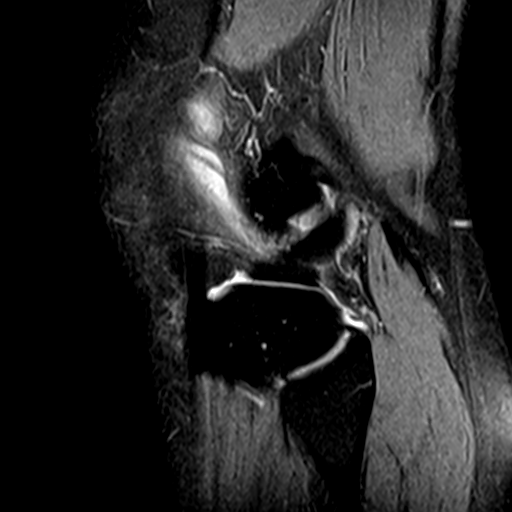
[im 13/32]
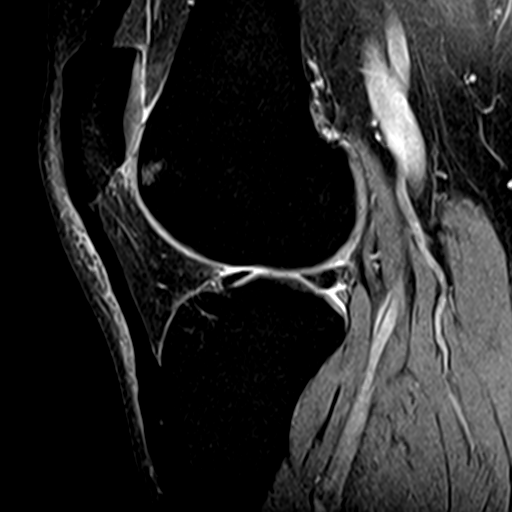
[im 19/32]
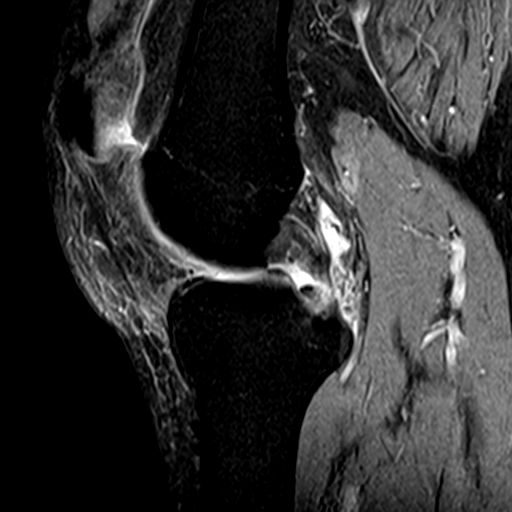
[im 25/32]
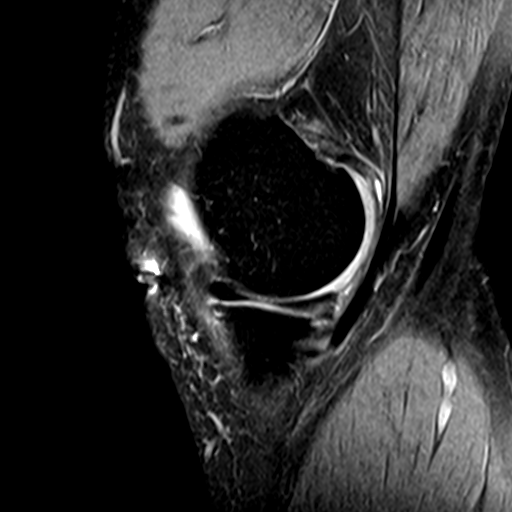
[im 32/32]
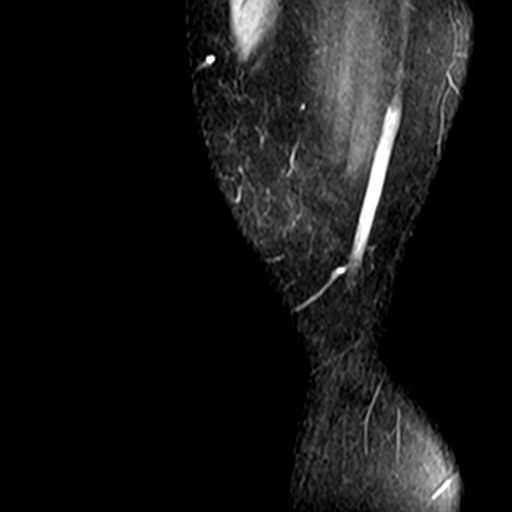

[Series 8: T2 fat-sat · sagittal · 3.0mm · 0.29mm/px · 5 of 32 slices shown (3 of 3)]
[im 1/32]
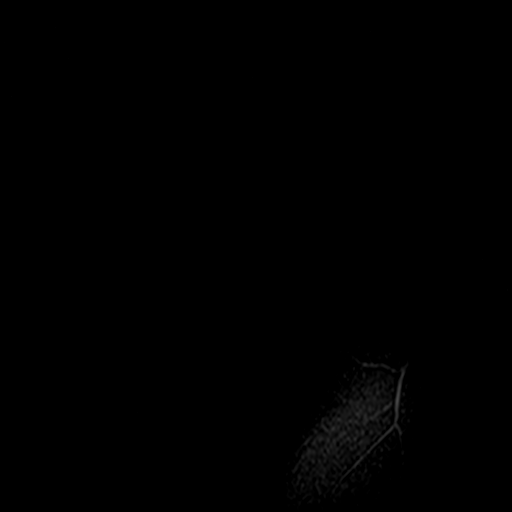
[im 7/32]
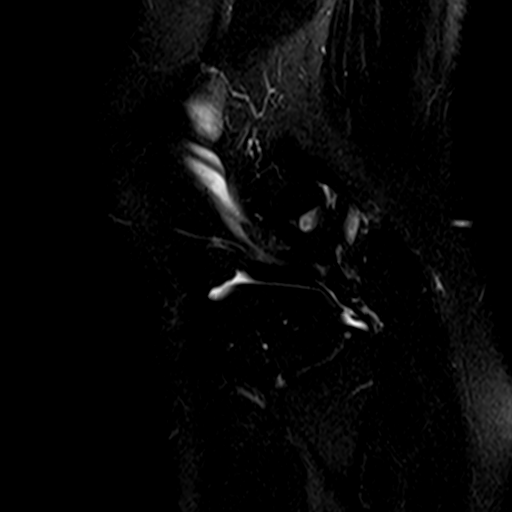
[im 13/32]
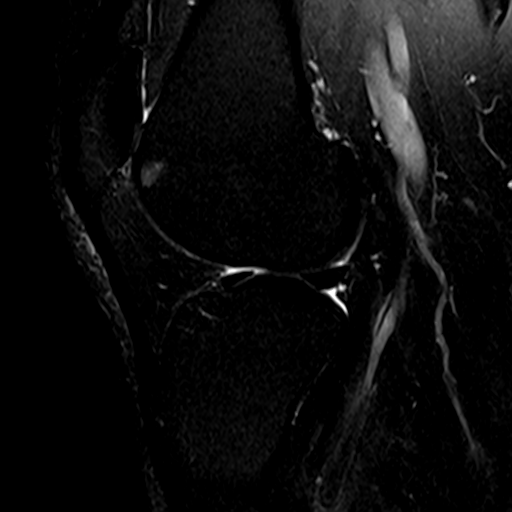
[im 19/32]
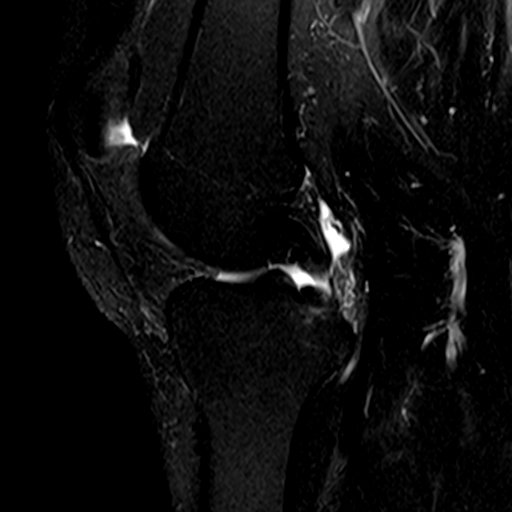
[im 25/32]
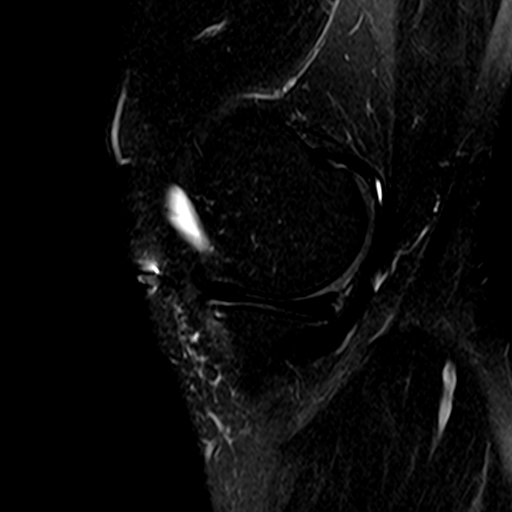

[Series 9: PD fat-sat · coronal · 2.0mm · 0.59mm/px · 4 of 22 slices shown (3 of 3)]
[im 1/22]
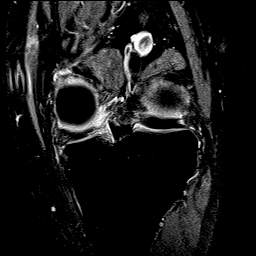
[im 8/22]
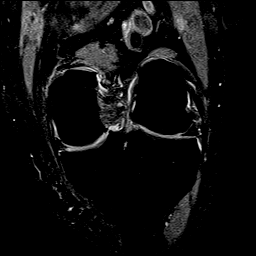
[im 15/22]
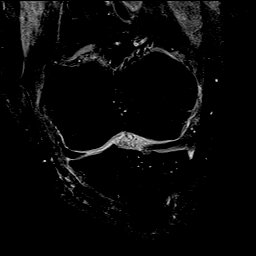
[im 22/22]
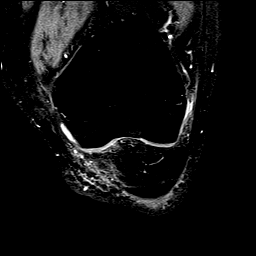

[33 of 40 positions shown; findings below may reference images not displayed]

FINDINGS: MENISCI

Medial meniscus: Radial tear of the posterior horn of the medial
meniscus towards the meniscal root with peripheral meniscal
extrusion.

Lateral meniscus: Intact.

LIGAMENTS

Cruciates: Intact ACL and PCL.

Collaterals: Medial collateral ligament is intact. Lateral
collateral ligament complex is intact.

CARTILAGE

Patellofemoral: Partial-thickness cartilage loss of the medial
patellofemoral compartment. High-grade partial-thickness cartilage
loss of the lateral trochlea.

Medial: Partial-thickness cartilage loss of the medial femorotibial
compartment.

Lateral: Partial-thickness cartilage loss of the lateral
femorotibial compartment.

Joint: No joint effusion. Normal Hoffa's fat. No plical thickening.

Popliteal Fossa: No Baker's cyst.  Intact popliteus tendon.

Extensor Mechanism: Intact quadriceps tendon and patellofemoral
tendon.

Bones:  No acute osseous abnormality.  No aggressive osseous lesion.

Other: Muscles are normal. Small ganglion cyst in the inferior
medial aspect of Hoffa's fat.
IMPRESSION: 1. Radial tear of the posterior horn of the medial meniscus towards
the meniscal root with peripheral meniscal extrusion.
2.  Tricompartmental cartilage abnormalities as described above.
